# Patient Record
Sex: Male | Born: 2000 | Hispanic: No | Marital: Single | Smoking: Current some day smoker
Health system: Southern US, Community
[De-identification: ages and names within clinical notes are randomized; demographics above are authoritative.]

## PROBLEM LIST (undated history)

## (undated) DIAGNOSIS — S065X9A Traumatic subdural hemorrhage with loss of consciousness of unspecified duration, initial encounter: Secondary | ICD-10-CM

## (undated) DIAGNOSIS — R5600 Simple febrile convulsions: Secondary | ICD-10-CM

## (undated) HISTORY — PX: SUBDURAL HEMATOMA EVACUATION VIA CRANIOTOMY: SUR319

## (undated) HISTORY — PX: INGUINAL HERNIA REPAIR: SUR1180

## (undated) HISTORY — DX: Simple febrile convulsions: R56.00

## (undated) HISTORY — DX: Traumatic subdural hemorrhage with loss of consciousness of unspecified duration, initial encounter: S06.5X9A

---
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
## 2018-02-20 DIAGNOSIS — F4323 Adjustment disorder with mixed anxiety and depressed mood: Secondary | ICD-10-CM | POA: Diagnosis not present

## 2018-03-12 DIAGNOSIS — F4323 Adjustment disorder with mixed anxiety and depressed mood: Secondary | ICD-10-CM | POA: Diagnosis not present

## 2018-03-26 DIAGNOSIS — F4323 Adjustment disorder with mixed anxiety and depressed mood: Secondary | ICD-10-CM | POA: Diagnosis not present

## 2018-05-28 ENCOUNTER — Encounter: Payer: Self-pay | Admitting: Pediatrics

## 2018-05-28 ENCOUNTER — Ambulatory Visit (INDEPENDENT_AMBULATORY_CARE_PROVIDER_SITE_OTHER): Payer: Medicaid Other | Admitting: Pediatrics

## 2018-05-28 ENCOUNTER — Other Ambulatory Visit: Payer: Self-pay

## 2018-05-28 DIAGNOSIS — R1013 Epigastric pain: Secondary | ICD-10-CM

## 2018-05-28 MED ORDER — OMEPRAZOLE 20 MG PO CPDR: DELAYED_RELEASE_CAPSULE | ORAL | 0 refills | Status: DC

## 2018-05-28 NOTE — Patient Instructions (Addendum)
Start the omeprazole as prescribed - one capsule daily for up to 30 days. This will help lower stomach acid so you will have less of the burning, empty sensation.  Drink lots of water. Avoid excessive amount of spicy, fatty or acid rich foods. No caffeine, alcohol or smoking/vaping - these behaviors could make the stomach problem worse.  Please call if not feeling better in the next week or if you feel worse or have new symptoms at any point.

## 2018-05-29 NOTE — Progress Notes (Signed)
Virtual Visit via Video Note  I connected with Roberto Hubbard   on 05/29/18 at 12 noon by a video enabled telemedicine application and verified that I am speaking with the correct person using two identifiers.   Location of patient/parent: outdoors   I discussed the limitations of evaluation and management by telemedicine and the availability of in person appointments.  I discussed that the purpose of this phone visit is to provide medical care while limiting exposure to the novel coronavirus.  The patient expressed understanding and agreed to proceed.  Reason for visit: stomach pain  History of Present Illness: Roberto Hubbard states he has had stomach pain for about one week that localizes to area above his belly button.  States sometimes nausea but no vomiting or diarrhea.  Describes pain as feeling like he has an "empty stomach".  Does not radiate to chest or back. Normal stool x 1 today and x 2-3 yesterday. No medicine or modifying factors. Nonsmoker and states only occasional alcohol.  States no fever, rash or cold symptoms. States otherwise his usual self.  PMH, problem list, medications and allergies, family and social history reviewed and updated as indicated.   Observations/Objective: Roberto Hubbard is viewed in an outdoors environment conversing with no signs of SOB. Color is good. He shows physician observer location of pain pointing to area above umbilicus but below xyphoid. He is instructed by physician to push on area and describe any pain; states no pain to palpation or pressure.  Assessment and Plan: 1. Epigastric pain Roberto Hubbard appears in overall good health by simple observation and is ambulating, conversing without voiced pain.  States no pain on palpation.  Statement of daily pain that feels like "empty stomach" and occasional nausea can be consistent with dyspepsia from excessive acid.  Will try trial of omeprazole and dietary manipulation (discussed with pt and listed in instructions).   Advised adequate fluids.  Follow up if medication intolerance, increased symptoms, lack of improvement within a week or concerns. No current concern of acute abdomen and no current need for imaging or labs. Cannot rule out minor ulcer at this time but initial treatment trial is appropriate and follow up defined. - omeprazole (PRILOSEC) 20 MG capsule; Take one capsule by mouth once a day for up to 30 days to relieve stomach pain and excess acid  Dispense: 30 capsule; Refill: 0  Follow Up Instructions: as needed   I discussed the assessment and treatment plan with the patient and/or parent/guardian. They were provided an opportunity to ask questions and all were answered. They agreed with the plan and demonstrated an understanding of the instructions.   They were advised to call back or seek an in-person evaluation in the emergency room if the symptoms worsen or if the condition fails to improve as anticipated.  I provided 10 minutes of non-face-to-face time and 5 minutes of care coordination during this encounter I was located at [facility] during this encounter.  Maree Erie, MD

## 2018-06-24 ENCOUNTER — Telehealth: Payer: Self-pay | Admitting: *Deleted

## 2018-06-24 ENCOUNTER — Encounter: Payer: Self-pay | Admitting: Pediatrics

## 2018-06-24 ENCOUNTER — Other Ambulatory Visit: Payer: Self-pay

## 2018-06-24 ENCOUNTER — Ambulatory Visit (INDEPENDENT_AMBULATORY_CARE_PROVIDER_SITE_OTHER): Payer: Medicaid Other | Admitting: Pediatrics

## 2018-06-24 DIAGNOSIS — Z20828 Contact with and (suspected) exposure to other viral communicable diseases: Secondary | ICD-10-CM | POA: Diagnosis not present

## 2018-06-24 DIAGNOSIS — Z20822 Contact with and (suspected) exposure to covid-19: Secondary | ICD-10-CM

## 2018-06-24 NOTE — Telephone Encounter (Signed)
From Dr. Kalman Jewels  I am requesting a test for Covid 19 for this 18 year old with a known exposure. He was at a family gathering 14 days ago. Since then 2 contacts at that gathering have tested positive. One was hospitalized 5 days ago and sent home 2 days ago. Patient has mild symptoms cough and fatigue. Subjective fever 10 days ago. Symptoms off and on for 10 days. No high risk family members and he does not work. He has been instructed to quarantine until called for testing and until results return.    Pt scheduled for testing tomorrow at [facility] site. Reviewed process with pt, verbalizes understanding. Pts# [phone]  Practice: [facility] Ph# [phone] Fax not provided.

## 2018-06-24 NOTE — Progress Notes (Signed)
Virtual Visit via Video Note  I connected with Roberto Hubbard 's patient  on 06/24/18 at  4:20 PM EDT by a video enabled telemedicine application and verified that I am speaking with the correct person using two identifiers.   Location of patient/parent: Car-parked   I discussed the limitations of evaluation and management by telemedicine and the availability of in person appointments.  I discussed that the purpose of this phone visit is to provide medical care while limiting exposure to the novel coronavirus.  The patient expressed understanding and agreed to proceed.  Reason for visit: Covid 19 exposure  History of Present Illness:   This 18 year old young man is concerned because he has had a covid 19 exposure. He was at a family gathering with several family members and family friends 2 weeks ago. Since that time 2 people at that gathering have tested positive for covid 19. One family friend was hospitalized for Covid 19 complications 5 days ago and sent home from hospital 2 days ago. Unique reports that 10 days ago he developed cough, subjective fever, fatigue and nausea. The fever resolved quickly but he has had intermittent cough and nausea since then. He denies shortness of breath. He denies chest pain. He is wel hydrated and appetite is normal. He lives with is parents 41 and 27 years ols and his 25 year old brother. No-one at home has underlying medical conditions. He does not currently work or go to school. He is in the car with 2 cousins, both have cough.    Observations/Objective: Alert 18 year old in NAD without cough.   Assessment and Plan:   1. Exposure to Covid-19 Virus Patient with known exposure to COVID 19 and mild symptoms. Testing ordered at [facility]. Testing site to notify patient.  Patient instructed to stay quarantined until test result is negative or until 14 days post onset of symptoms and symptom free.  Other contacts in the car have been instructed to  call their PCP or the [facility] hotline to discuss getting tested. Until then all have been asked to quarantine at home for 14 days after onset of symptoms and resolution of symptoms.  Patient given strict instruction on when to present to ER-worsening cough/fever/shortness of breath or chest pain and to go to [facility] ER f these symptoms develop.    Follow Up Instructions: as above   I discussed the assessment and treatment plan with the patient and/or parent/guardian. They were provided an opportunity to ask questions and all were answered. They agreed with the plan and demonstrated an understanding of the instructions.   They were advised to call back or seek an in-person evaluation in the emergency room if the symptoms worsen or if the condition fails to improve as anticipated.  I provided 20 minutes of non-face-to-face time and 10 minutes of care coordination during this encounter I was located at [facility] during this encounter.  Roberto Jewels, MD

## 2018-06-25 ENCOUNTER — Other Ambulatory Visit: Payer: Medicaid Other

## 2018-06-25 DIAGNOSIS — Z20822 Contact with and (suspected) exposure to covid-19: Secondary | ICD-10-CM

## 2018-06-25 DIAGNOSIS — R6889 Other general symptoms and signs: Secondary | ICD-10-CM | POA: Diagnosis not present

## 2018-06-27 LAB — NOVEL CORONAVIRUS, NAA: SARS-CoV-2, NAA: NOT DETECTED

## 2018-07-06 ENCOUNTER — Ambulatory Visit (INDEPENDENT_AMBULATORY_CARE_PROVIDER_SITE_OTHER): Payer: Medicaid Other | Admitting: Pediatrics

## 2018-07-06 ENCOUNTER — Other Ambulatory Visit: Payer: Self-pay

## 2018-07-06 DIAGNOSIS — R109 Unspecified abdominal pain: Secondary | ICD-10-CM | POA: Diagnosis not present

## 2018-07-06 NOTE — Progress Notes (Signed)
Virtual Visit via Video Note: attempted video and technology failed, patient consented to phone visit  I connected with Roberto Hubbard on 07/06/18 at  2:10 PM EDT by a phone and verified that I am speaking with the correct person using two identifiers.  Location: Patient: Home Provider: [place]    I discussed the limitations of evaluation and management by telemedicine and the availability of in person appointments. The patient expressed understanding and agreed to proceed.  History of Present Illness: Progressive nausea/vomiting and belly pain after eating over the last 3 months.  Belly symptoms specifically noted by patient to start after death of a brother.  He said he couldn't eat for 2 wks and "hasn't been right since".  Denies blood in vomit, no bowel symptoms, , no urinary symptoms, no rashes, no injuries, no connection to fatty foods.  Denies mobility of pain/fullness and says it is always immediately below ribs, cnetral/upper abdomen.  Says weight is 180s (30lbs more than last charted in 2018)   Observations/Objective: Visit over the phone due to technicial difficulties, patient clmly speaking in full sentences, no distress.  Did get emotional when discussing brother.  Assessment and Plan: Functional abdominal pain syndrome  Continue omperazole, can add prn maalox, will refer to behavioral health (this was discussed with patient and he is interested in investigating emotional components to these symptoms), will try to further reduce soda and increase water/fiber  Follow Up Instructions:    I discussed the assessment and treatment plan with the patient. The patient was provided an opportunity to ask questions and all were answered. The patient agreed with the plan and demonstrated an understanding of the instructions.   The patient was advised to call back or seek an in-person evaluation if the symptoms worsen or if the condition fails to improve as anticipated.  I provided 18  minutes of non-face-to-face time during this encounter.   Sherene Sires, DO   I was present during the entirety of this clinical encounter via phone visit, and was immediately available for the key elements of the service.  I developed the management plan that is described in the resident's note and we discussed it during the visit. I agree with the content of this note and it accurately reflects my decision making and observations.  Antony Odea, MD 07/06/18 4:19 PM

## 2018-07-07 ENCOUNTER — Emergency Department (HOSPITAL_COMMUNITY): Payer: Medicaid Other

## 2018-07-07 ENCOUNTER — Ambulatory Visit (INDEPENDENT_AMBULATORY_CARE_PROVIDER_SITE_OTHER): Payer: Medicaid Other | Admitting: Pediatrics

## 2018-07-07 ENCOUNTER — Encounter (HOSPITAL_COMMUNITY): Payer: Self-pay

## 2018-07-07 ENCOUNTER — Emergency Department (HOSPITAL_COMMUNITY)
Admission: EM | Admit: 2018-07-07 | Discharge: 2018-07-07 | Disposition: A | Discharge status: Home / Self Care | Payer: Medicaid Other | Attending: Emergency Medicine | Admitting: Emergency Medicine

## 2018-07-07 ENCOUNTER — Encounter: Payer: Self-pay | Admitting: Clinical

## 2018-07-07 ENCOUNTER — Ambulatory Visit: Payer: Medicaid Other | Admitting: Pediatrics

## 2018-07-07 ENCOUNTER — Other Ambulatory Visit: Payer: Self-pay

## 2018-07-07 ENCOUNTER — Ambulatory Visit (INDEPENDENT_AMBULATORY_CARE_PROVIDER_SITE_OTHER): Payer: Medicaid Other | Admitting: Clinical

## 2018-07-07 VITALS — BP 108/72 | Temp 97.7°F | Wt 169.6 lb

## 2018-07-07 DIAGNOSIS — R10812 Left upper quadrant abdominal tenderness: Secondary | ICD-10-CM | POA: Insufficient documentation

## 2018-07-07 DIAGNOSIS — R1013 Epigastric pain: Secondary | ICD-10-CM

## 2018-07-07 DIAGNOSIS — G8929 Other chronic pain: Secondary | ICD-10-CM | POA: Diagnosis not present

## 2018-07-07 DIAGNOSIS — F4329 Adjustment disorder with other symptoms: Secondary | ICD-10-CM

## 2018-07-07 DIAGNOSIS — K824 Cholesterolosis of gallbladder: Secondary | ICD-10-CM | POA: Diagnosis not present

## 2018-07-07 DIAGNOSIS — R112 Nausea with vomiting, unspecified: Secondary | ICD-10-CM | POA: Insufficient documentation

## 2018-07-07 DIAGNOSIS — R634 Abnormal weight loss: Secondary | ICD-10-CM | POA: Diagnosis not present

## 2018-07-07 DIAGNOSIS — Z87820 Personal history of traumatic brain injury: Secondary | ICD-10-CM | POA: Insufficient documentation

## 2018-07-07 DIAGNOSIS — Z79899 Other long term (current) drug therapy: Secondary | ICD-10-CM | POA: Insufficient documentation

## 2018-07-07 DIAGNOSIS — Z87891 Personal history of nicotine dependence: Secondary | ICD-10-CM | POA: Diagnosis not present

## 2018-07-07 DIAGNOSIS — R10811 Right upper quadrant abdominal tenderness: Secondary | ICD-10-CM | POA: Diagnosis not present

## 2018-07-07 DIAGNOSIS — R1084 Generalized abdominal pain: Secondary | ICD-10-CM | POA: Diagnosis not present

## 2018-07-07 DIAGNOSIS — R109 Unspecified abdominal pain: Secondary | ICD-10-CM

## 2018-07-07 LAB — CBC WITH DIFFERENTIAL/PLATELET
Abs Immature Granulocytes: 0.01 10*3/uL (ref 0.00–0.07)
Basophils Absolute: 0.1 10*3/uL (ref 0.0–0.1)
Basophils Relative: 1 %
Eosinophils Absolute: 0 10*3/uL (ref 0.0–0.5)
Eosinophils Relative: 1 %
HCT: 48.4 % (ref 39.0–52.0)
Hemoglobin: 17.4 g/dL — ABNORMAL HIGH (ref 13.0–17.0)
Immature Granulocytes: 0 %
Lymphocytes Relative: 30 %
Lymphs Abs: 1.5 10*3/uL (ref 0.7–4.0)
MCH: 31.9 pg (ref 26.0–34.0)
MCHC: 36 g/dL (ref 30.0–36.0)
MCV: 88.8 fL (ref 80.0–100.0)
Monocytes Absolute: 0.4 10*3/uL (ref 0.1–1.0)
Monocytes Relative: 8 %
Neutro Abs: 3 10*3/uL (ref 1.7–7.7)
Neutrophils Relative %: 60 %
Platelets: 294 10*3/uL (ref 150–400)
RBC: 5.45 MIL/uL (ref 4.22–5.81)
RDW: 12.8 % (ref 11.5–15.5)
WBC: 5 10*3/uL (ref 4.0–10.5)
nRBC: 0 % (ref 0.0–0.2)

## 2018-07-07 LAB — COMPREHENSIVE METABOLIC PANEL
ALT: 27 U/L (ref 0–44)
AST: 21 U/L (ref 15–41)
Albumin: 4.6 g/dL (ref 3.5–5.0)
Alkaline Phosphatase: 66 U/L (ref 38–126)
Anion gap: 11 (ref 5–15)
BUN: 16 mg/dL (ref 6–20)
CO2: 23 mmol/L (ref 22–32)
Calcium: 9.5 mg/dL (ref 8.9–10.3)
Chloride: 106 mmol/L (ref 98–111)
Creatinine, Ser: 1.03 mg/dL (ref 0.61–1.24)
GFR calc Af Amer: >60 mL/min (ref >60)
GFR calc non Af Amer: >60 mL/min (ref >60)
Glucose, Bld: 97 mg/dL (ref 70–99)
Potassium: 4 mmol/L (ref 3.5–5.1)
Sodium: 140 mmol/L (ref 135–145)
Total Bilirubin: 1.2 mg/dL (ref 0.3–1.2)
Total Protein: 7.4 g/dL (ref 6.5–8.1)

## 2018-07-07 LAB — LIPASE, BLOOD: Lipase: 31 U/L (ref 11–51)

## 2018-07-07 MED ORDER — ONDANSETRON 4 MG PO TBDP: 4.0000 mg | ORAL_TABLET | Freq: Three times a day (TID) | ORAL | 0 refills | Status: DC | PRN

## 2018-07-07 MED ORDER — ALUM & MAG HYDROXIDE-SIMETH 200-200-20 MG/5ML PO SUSP
30.0000 mL | Freq: Once | ORAL | Status: AC
  Administered 2018-07-07: 30 mL via ORAL
  Filled 2018-07-07: qty 30

## 2018-07-07 MED ORDER — LIDOCAINE VISCOUS HCL 2 % MT SOLN
15.0000 mL | Freq: Once | OROMUCOSAL | Status: AC
  Administered 2018-07-07: 15 mL via ORAL
  Filled 2018-07-07: qty 15

## 2018-07-07 MED ORDER — SODIUM CHLORIDE 0.9 % IV BOLUS
1000.0000 mL | Freq: Once | INTRAVENOUS | Status: AC
  Administered 2018-07-07: 1000 mL via INTRAVENOUS

## 2018-07-07 MED ORDER — FAMOTIDINE IN NACL 20-0.9 MG/50ML-% IV SOLN
20.0000 mg | Freq: Once | INTRAVENOUS | Status: AC
  Administered 2018-07-07: 20 mg via INTRAVENOUS
  Filled 2018-07-07: qty 50

## 2018-07-07 MED ORDER — OMEPRAZOLE 40 MG PO CPDR: 40.0000 mg | DELAYED_RELEASE_CAPSULE | Freq: Every day | ORAL | 0 refills | Status: DC

## 2018-07-07 MED ORDER — SUCRALFATE 1 GM/10ML PO SUSP: 1.0000 g | Freq: Three times a day (TID) | ORAL | 0 refills | Status: DC

## 2018-07-07 NOTE — ED Triage Notes (Addendum)
Pt sent by PCP for evaluation of mid, upper abd pain x a couple of months, but says it has gotten worse in the past month and a half; endorses nausea today; denies cough, fever, diarrhea; endorses some vomiting; denies recent sick contacts; pt states recent travel to [facility], from last Thursday until yesterday

## 2018-07-07 NOTE — ED Provider Notes (Signed)
[facility] Provider Note   CSN: [id number] Arrival date & time: 07/07/18  1220     History   Chief Complaint Chief Complaint  Patient presents with  . Abdominal Pain    HPI Roberto Hubbard is a 18 y.o. male with a history of prior bilateral inguinal hernia repairs who presents to the emergency department with complaints of abdominal pain which has been occurring for the past 2 to 3 months.  Patient states that his symptoms began about 6 months ago with the death of his sibling, he states at that time he was not really eating much and was having some mild abdominal discomfort, this seemed to have resolved, however about 2 to 3 months ago discomfort returned.  He states he is having pain to the epigastric area which has become constant and more severe over the past month or so. Reports the pain is to the epigastric area, it is sharp, constant, worse with eating, no alleviating factors. About 2 weeks ago he just developed associated nausea and vomiting, vomiting typically occurs after PO intake, able to tolerate PO sometimes though.  He was seen by an outpatient provider and started on omeprazole 20 mg daily approximately 5 days ago, he has been taking this without relief of his symptoms.  Denies fever, chills, hematemesis, chest pain, dyspnea, diarrhea, melena, hematochezia, or constipation.  Offered translator line, however patient would prefer to speak [place].    HPI  Past Medical History:  Diagnosis Date  . Subdural hematoma (HCC) 01/23/2014    Patient Active Problem List   Diagnosis Date Noted  . Right varicocele 10/22/2016  . Personal history of traumatic brain injury 01/23/2014    Past Surgical History:  Procedure Laterality Date  . INGUINAL HERNIA REPAIR     bilateral        Home Medications    Prior to Admission medications   Medication Sig Start Date End Date Taking? Authorizing Provider  omeprazole (PRILOSEC) 20 MG  capsule Take one capsule by mouth once a day for up to 30 days to relieve stomach pain and excess acid 05/28/18   Maree ErieStanley, Angela J, MD    Family History Family History  Problem Relation Age of Onset  . Cancer Paternal Grandmother        bone, throat  . Cancer Paternal Aunt        uterine  . Diabetes Maternal Grandmother     Social History Social History   Tobacco Use  . Smoking status: Former Games developermoker  . Smokeless tobacco: Never Used  Substance Use Topics  . Alcohol use: No  . Drug use: No     Allergies   Patient has no known allergies.   Review of Systems Review of Systems  Constitutional: Negative for chills and fever.  Respiratory: Negative for shortness of breath.   Cardiovascular: Negative for chest pain.  Gastrointestinal: Positive for abdominal pain, nausea and vomiting. Negative for anal bleeding, blood in stool, constipation and diarrhea.  Genitourinary: Negative for dysuria.  Neurological: Negative for syncope.  All other systems reviewed and are negative.    Physical Exam Updated Vital Signs BP 130/90 (BP Location: Right Arm)   Pulse (!) 58   Temp 98.1 F (36.7 C) (Oral)   Resp 16   Ht 5\' 8"  (1.727 m)   Wt 73.5 kg   SpO2 99%   BMI 24.63 kg/m   Physical Exam Vitals signs and nursing note reviewed.  Constitutional:  General: He is not in acute distress.    Appearance: He is well-developed. He is not toxic-appearing.  HENT:     Head: Normocephalic and atraumatic.  Eyes:     General:        Right eye: No discharge.        Left eye: No discharge.     Conjunctiva/sclera: Conjunctivae normal.  Neck:     Musculoskeletal: Neck supple.  Cardiovascular:     Rate and Rhythm: Normal rate and regular rhythm.  Pulmonary:     Effort: Pulmonary effort is normal. No respiratory distress.     Breath sounds: Normal breath sounds. No wheezing, rhonchi or rales.  Abdominal:     General: There is no distension.     Palpations: Abdomen is soft.      Tenderness: There is abdominal tenderness. There is no guarding or rebound. Negative signs include Murphy's sign.     Comments: Diffuse upper abdominal tenderness with the epigastric area being the most prominent area of tenderness palpation.  Skin:    General: Skin is warm and dry.     Findings: No rash.  Neurological:     Mental Status: He is alert.     Comments: Clear speech.   Psychiatric:        Behavior: Behavior normal.    ED Treatments / Results  Labs (all labs ordered are listed, but only abnormal results are displayed) Labs Reviewed  CBC WITH DIFFERENTIAL/PLATELET - Abnormal; Notable for the following components:      Result Value   Hemoglobin 17.4 (*)    All other components within normal limits  COMPREHENSIVE METABOLIC PANEL  LIPASE, BLOOD    EKG    Radiology US Abdomen Limited Ruq  Result Date: 07/07/2018 CLINICAL DATA:  Abdominal pain, epigastric pain for several months EXAM: ULTRASOUND ABDOMEN LIMITED RIGHT UPPER QUADRANT COMPARISON:  None FINDINGS: Gallbladder: Normally distended without stones or wall thickening. 4 mm diameter gallbladder polyp identified. No sonographic Murphy sign or pericholecystic fluid Common bile duct: Diameter: 2 mm diameter, normal Liver: Normal parenchymal echogenicity. Small hyperechoic nodule anteriorly within liver 2.0 x 2.6 x 1.9 cm question small hemangioma. No additional hepatic mass or nodularity. Portal vein is patent on color Doppler imaging with normal direction of blood flow towards the liver. No RIGHT upper quadrant free fluid. IMPRESSION: 4 mm gallbladder polyp. Hyperechoic hepatic nodule 2.0 x 2.6 x 1.9 cm, suspect small hemangioma. Otherwise negative exam. Electronically Signed   By: Lavonia Dana M.D.   On: 07/07/2018 13:42    Procedures Procedures (including critical care time)  Medications Ordered in ED Medications  famotidine (PEPCID) IVPB 20 mg premix (20 mg Intravenous New Bag/Given 07/07/18 1256)  sodium chloride 0.9  % bolus 1,000 mL (1,000 mLs Intravenous New Bag/Given 07/07/18 1255)  alum & mag hydroxide-simeth (MAALOX/MYLANTA) 200-200-20 MG/5ML suspension 30 mL (30 mLs Oral Given 07/07/18 1253)    And  lidocaine (XYLOCAINE) 2 % viscous mouth solution 15 mL (15 mLs Oral Given 07/07/18 1253)     Initial Impression / Assessment and Plan / ED Course  I have reviewed the triage vital signs and the nursing notes.  Pertinent labs & imaging results that were available during my care of the patient were reviewed by me and considered in my medical decision making (see chart for details).    Patient presents to the ED with complaints of abdominal pain. Patient nontoxic appearing, in no apparent distress, vitals without significant abnormality. On exam patient tender to  upper abdomen mostly to epigastric area, no peritoneal signs, negative murphys. Will evaluate with labs and RUQ US. Trial of pepcid, GI cocktail & fluids.   ER work-up reviewed & reassuring:  CBC: No anemia or leukocytosis  CMP: No electrolyte derangement. LFTs & renal function preserved.  Lipase: WNL Imaging- RUQ US: 4 mm gallbladder polyp. Hyperechoic hepatic nodule 2.0 x 2.6 x 1.9 cm, suspect small hemangioma. Otherwise negative exam.  On repeat abdominal exam patient remains without peritoneal signs, doubt cholecystitis, pancreatitis, diverticulitis, appendicitis, or bowel obstruction/perforation. He is not having hematemesis, melena/hematochezia, BUN elevation or anemia to suggest acute GI bleed. Suspect GERD vs. PUD. Will increase omeprazole to 40 mg daily and provide prescription for Carafate as well as a few tablets of PRN zofran as patient is feeling much better in the Er and tolerating PO.  Will discharge home with supportive measures. I discussed results, treatment plan, need for PCP follow-up, and return precautions with the patient. Provided opportunity for questions, patient confirmed understanding and is in agreement with plan.   Final  Clinical Impressions(s) / ED Diagnoses   Final diagnoses:  Abdominal pain    ED Discharge Orders         Ordered    omeprazole (PRILOSEC) 40 MG capsule  Daily     07/07/18 1425    sucralfate (CARAFATE) 1 GM/10ML suspension  3 times daily with meals & bedtime     07/07/18 1425    ondansetron (ZOFRAN ODT) 4 MG disintegrating tablet  Every 8 hours PRN     07/07/18 1425           Stanlee Roehrig, BowerstonSamantha R, PA-C 07/07/18 1427    Gerhard MunchLockwood, Robert, MD 07/07/18 1555

## 2018-07-07 NOTE — BH Specialist Note (Signed)
Integrated Behavioral Health Initial Visit  MRN: [id number] Name: Roberto Hubbard  Number of [place] Clinician visits:: 1/6 Session Start time: 10:40 AM     Session End time: 11:00 AM  Total time: 20 minutes  Type of Service: [place] Interpretor:No. Interpretor Name and Language: n/a   Warm Hand Off Completed.       SUBJECTIVE: Roberto Hubbard is a 18 y.o. male accompanied by self Patient was referred by Dr. Excell Seltzer for grief, stomach pains. 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 Increase knowledge and/or ability of: coping skills - relaxation skills   INTERVENTIONS: Interventions utilized: Mindfulness or Relaxation Training  Standardized Assessments completed: Not Needed  ASSESSMENT: Patient currently experiencing abdominal pain, nausea which has disrupted his work and sleep.   Patient may benefit from scheduling a follow up with his therapist at [place].  PLAN: 1. Follow up with behavioral health clinician on : No follow up since patient has therapist 2. Behavioral recommendations: Follow up with therapist at [facility] 3. Referral(s): Armed forces logistics/support/administrative officer (LME/Outside Clinic) Roberto Hubbard will follow up with therapist a [place], [place] 4. "From scale of 1-10, how likely are you to follow plan?": Roberto Hubbard agreeable to the plan above  Roberto Rakes, Roberto Hubbard

## 2018-07-07 NOTE — Progress Notes (Signed)
Subjective:        History provider by patient No interpreter necessary.  Chief Complaint  Patient presents with  . Abdominal Pain    ongoing abd pains. omeprazole not helpful. epigastric area.   . Nausea    with occas emesis.   . Weight Loss    patient states weighed 186# 2 wks ago.     HPI: Shaheem Pichon [place], is a 18 y.o. male who presents to clinic with 2 months of worsening epigastric pain. He reports that around 5 months ago that his brother passed away in a tragic accident and then two weeks after he did not eat or drink anything. Afterwards he slowly started eating again and was able to resume a normal diet. Around 2 months ago, he started having abdominal pain especially in his epigastric region that was intermittent and usually present once a week. About two weeks ago, the pain became severe 8/10 and also has constant nausea. He has not been able to keep food or liquids down and has been vomiting food. He denies any blood his vomit. He reports that he went to [facility] recently in the past few days and was continuing to have pain and nausea there. He reports that he checked his weight three weeks ago and weighed about 186 pounds, and has since lost 17 lbs. He also reports night sweats for the past few days, in which he gets completely drenched. He denies any cough, congestion, chest pain, diarrhea. He reports that he has been having difficulty having regular bowel movements and regularly sits on the toilet and nothing comes out. He was started on omeprazole 20 mg daily on 6/11 and has been taking it daily, with no changes to his symptoms thus far. He does note that about 4 weeks ago he came into contact with someone with COVID but was tested on 6/4 and it was negative. He does endorse dizziness with upright but denies any incidents or feeling of passing out. His pain has affected his ability to sleep and completed normal activities.   Social: He lives with his mother, father, and 41 year  old brother. He reports alcohol use about 10 beers in the past 5 months. He says that his use has decreased since his brother's death. He reports marijuana use every other day but decreased since starting omeprazole. He denies other illicit drugs. He was last sexually active a month ago. He denies any dysuria or penile discharge.   Review of Systems  Constitutional: Positive for activity change, appetite change and unexpected weight change. Negative for fever.  HENT: Negative for congestion, rhinorrhea, sneezing and sore throat.   Eyes: Negative for pain, discharge and redness.  Respiratory: Negative for cough, chest tightness and shortness of breath.   Gastrointestinal: Positive for abdominal pain, nausea and vomiting. Negative for blood in stool and constipation.  Genitourinary: Negative for difficulty urinating, discharge, dysuria, flank pain, frequency, penile pain, penile swelling, scrotal swelling and testicular pain.  Musculoskeletal: Negative for arthralgias and myalgias.  Skin: Negative for color change, pallor and wound.  Neurological: Positive for dizziness and light-headedness. Negative for syncope.     Patient's history was reviewed and updated as appropriate: allergies, current medications, past family history, past medical history, past social history, past surgical history and problem list.     Objective:     BP 108/72 (Patient Position: Sitting)   Temp 97.7 F (36.5 C) (Temporal)   Wt 169 lb 9.6 oz (76.9 kg)   BMI  27.58 kg/m   Physical Exam GEN: Awake, alert in no acute distress, Able to converse appropriately, reports nausea while talking  HEENT: Normocephalic, atraumatic. PERRL. Conjunctiva clear. TM normal bilaterally. Moist mucus membranes. Oropharynx normal with no erythema or exudate. Neck supple. No cervical lymphadenopathy.  CV: Regular rate and rhythm. No murmurs, rubs or gallops. Normal radial pulses and capillary refill. RESP: Normal work of breathing. Lungs  clear to auscultation bilaterally with no wheezes, rales or crackles.  GI: Hypoactive bowel sounds. Abdomen soft, tender to palpate in epigastric region, feels referred pain when palpating in RLQ, LUQ, RUQ, non-distended with no hepatosplenomegaly or masses. No rebound tenderness. Negative psoas sign. Negative obturator. Negative murphy's sign.  SKIN: two small linear hyperpigmented skin lesion along left upper shoulder.  NEURO: Alert, moves all extremities normally.     Assessment & Plan:   Randi College is a 18 y.o. male who presented to clinic with 2 months of worsening epigastric pain and associated nausea and vomiting. On exam, he is stable, afebrile and in no acute distress. Given his symptoms of worsening abdominal pain, 17 lbs of weight loss in 3 weeks with associated nausea and emesis, concerned about intra-abdominal process. Differential includes PUD, Pancreatitis, Gastritis, or Gastroparesis. Gastric Malignancy seems unlikely given his age. I would not expect any improvement from the PPI as it was just started a few days ago but given his severe abdominal pain, N/V and weight loss, I feel that he would benefit from further evaluation involving CBC, CMP, Lipase, CRP, ESR, and abdominal imaging such as [place] to further assess etiologies. He would also benefit from EGD with Adult GI to rule out PUD. Discussed with the Pediatric ED and given this is a chronic issue, feel that he will benefit from better evaluation & management via direct admit to the pediatric floor as he has been seen by pediatric physicians in the clinic.   Supportive care and return precautions reviewed.   Richarda Overlie, MD PGY1

## 2018-07-07 NOTE — ED Notes (Signed)
Patient transported to Ultrasound 

## 2018-07-07 NOTE — Discharge Instructions (Signed)
You were seen in the emergency department today for abdominal pain and vomiting.  Your labs were reassuring.  Your ultrasound showed that you have a small gallbladder polyp and small liver hemangioma-each of these findings can be discussed with a primary care provider.  We suspect your symptoms are related to gastroesophageal reflux disease and/or peptic ulcer disease.  We have increased her dose of omeprazole to 40 mg daily.  We are also sending her with Carafate to take prior to meals and prior to bedtime as well as Zofran to take every 8 hours as needed for nausea and vomiting.  We have prescribed you new medication(s) today. Discuss the medications prescribed today with your pharmacist as they can have adverse effects and interactions with your other medicines including over the counter and prescribed medications. Seek medical evaluation if you start to experience new or abnormal symptoms after taking one of these medicines, seek care immediately if you start to experience difficulty breathing, feeling of your throat closing, facial swelling, or rash as these could be indications of a more serious allergic reaction  Please follow-up with your primary care provider within 3 to 5 days.  Follow the attached diet guidelines.  Return to the ER for new or worsening symptoms including but not limited to worsening pain, inability to keep fluids down, blood in vomit or stool, fever, or any other concerns.

## 2018-07-20 ENCOUNTER — Telehealth: Payer: Self-pay | Admitting: Pediatrics

## 2018-07-20 NOTE — Telephone Encounter (Signed)

## 2018-07-21 ENCOUNTER — Ambulatory Visit: Payer: Medicaid Other | Admitting: Pediatrics

## 2018-08-04 ENCOUNTER — Ambulatory Visit (INDEPENDENT_AMBULATORY_CARE_PROVIDER_SITE_OTHER): Payer: Medicaid Other | Admitting: Pediatrics

## 2018-08-04 ENCOUNTER — Other Ambulatory Visit: Payer: Self-pay

## 2018-08-04 ENCOUNTER — Encounter: Payer: Self-pay | Admitting: Pediatrics

## 2018-08-04 DIAGNOSIS — R1013 Epigastric pain: Secondary | ICD-10-CM | POA: Diagnosis not present

## 2018-08-04 MED ORDER — OMEPRAZOLE 40 MG PO CPDR: 40.0000 mg | DELAYED_RELEASE_CAPSULE | Freq: Two times a day (BID) | ORAL | 0 refills | Status: DC

## 2018-08-04 MED ORDER — ONDANSETRON 4 MG PO TBDP: 4.0000 mg | ORAL_TABLET | Freq: Three times a day (TID) | ORAL | 0 refills | Status: DC | PRN

## 2018-08-04 MED ORDER — SUCRALFATE 1 GM/10ML PO SUSP: 1.0000 g | Freq: Three times a day (TID) | ORAL | 0 refills | Status: DC

## 2018-08-04 NOTE — Progress Notes (Signed)
Virtual Visit via Video Note  I connected with Roberto Hubbard on 08/04/18 at  4:30 PM EDT by a video enabled telemedicine application and verified that I am speaking with the correct person using two identifiers.   Location of patient/parent: home   I discussed the limitations of evaluation and management by telemedicine and the availability of in person appointments.  I discussed that the purpose of this telehealth visit is to provide medical care while limiting exposure to the novel coronavirus.  The patient expressed understanding and agreed to proceed.  Reason for visit: follow-up abdominal pain  History of Present Illness: Seen in the ER on 6/16 with normal evaluation.  Rx increased PPI and prn zofran and carafate.  Pain was getting better until about 4 days ago.  Appetite is ok, trying to avoid certain foods such as meats.  He is eating more fruits and vegetables.  He is not drinking coffee, he is drinking less soda (now 3 times per week).  Symptoms are worse if he eats late at night.  Pain is located in the epigastric and RUQ.    Increased pain and nausea for the past 3-4 days.  He is trying to eat breakfast in the morning when he takes his medication.  Taking omeprazole 40 mg each morning and carafate prn.  Missed maybe 1-2 doses of omeprazole during the past month.  Doesn't feel like the carafate helps too much.  Ran out of the zofran but wasn't having much nausea until about 4 days ago.  No vomiting but feels very nauseated in the morning.  Pain is mostly in the morning.    ROS: No headaches.   Appetite - hungry some days, not hungry other days.  Not fatigued  GI: no constipation, mild diarrhea at times, no blood in stool GU: no dysuria, or urinary frequency NO fever  Observations/Objective: Patient reports tenderness to palpation over his RUQ, epigastric area, and LUQ.  Pain is reported and worst over the RUQ.  Assessment and Plan:  Epigastric abdominal pain Symptoms are  worsening from prior.  Pain is most consistent with gastritis vs GERD.  Not adequately controlled with 40 mg omeprazole daily.  Will increase to 40 mg BID (high dose PPI).  Also reviewed dietary and lifestyle changes to help with symptoms  Refilled zofran and carafate for prn use.  Follow-up in the office in a a week or two .  If not improving with high-dose PPI, then will need to pursue further evaluation and likely referral to GI.  - omeprazole (PRILOSEC) 40 MG capsule; Take 1 capsule (40 mg total) by mouth 2 (two) times a day.  Dispense: 60 capsule; Refill: 0 - ondansetron (ZOFRAN ODT) 4 MG disintegrating tablet; Take 1 tablet (4 mg total) by mouth every 8 (eight) hours as needed for nausea or vomiting.  Dispense: 10 tablet; Refill: 0 - sucralfate (CARAFATE) 1 GM/10ML suspension; Take 10 mLs (1 g total) by mouth 4 (four) times daily -  with meals and at bedtime.  Dispense: 420 mL; Refill: 0   Follow Up Instructions: onsite visit to follow-up abdominal pain in 1-2 weeks.    I discussed the assessment and treatment plan with the patient and/or parent/guardian. They were provided an opportunity to ask questions and all were answered. They agreed with the plan and demonstrated an understanding of the instructions.   They were advised to call back or seek an in-person evaluation in the emergency room if the symptoms worsen or if the  condition fails to improve as anticipated.   I was located at clinic during this encounter.  Clifton CustardKate Scott Genine Beckett, MD

## 2018-08-06 DIAGNOSIS — R1013 Epigastric pain: Secondary | ICD-10-CM | POA: Insufficient documentation

## 2018-08-11 ENCOUNTER — Ambulatory Visit: Payer: Medicaid Other | Admitting: Pediatrics

## 2018-08-14 ENCOUNTER — Ambulatory Visit: Payer: Medicaid Other | Admitting: Pediatrics

## 2018-08-21 ENCOUNTER — Encounter: Payer: Self-pay | Admitting: Pediatrics

## 2018-08-21 ENCOUNTER — Ambulatory Visit (INDEPENDENT_AMBULATORY_CARE_PROVIDER_SITE_OTHER): Payer: Medicaid Other | Admitting: Pediatrics

## 2018-08-21 ENCOUNTER — Other Ambulatory Visit: Payer: Self-pay

## 2018-08-21 VITALS — BP 116/68 | HR 76 | Ht 66.0 in | Wt 168.4 lb

## 2018-08-21 DIAGNOSIS — G479 Sleep disorder, unspecified: Secondary | ICD-10-CM

## 2018-08-21 DIAGNOSIS — Z639 Problem related to primary support group, unspecified: Secondary | ICD-10-CM

## 2018-08-21 DIAGNOSIS — Z68.41 Body mass index (BMI) pediatric, 85th percentile to less than 95th percentile for age: Secondary | ICD-10-CM

## 2018-08-21 DIAGNOSIS — K2970 Gastritis, unspecified, without bleeding: Secondary | ICD-10-CM

## 2018-08-21 DIAGNOSIS — Z0001 Encounter for general adult medical examination with abnormal findings: Secondary | ICD-10-CM

## 2018-08-21 DIAGNOSIS — Z113 Encounter for screening for infections with a predominantly sexual mode of transmission: Secondary | ICD-10-CM | POA: Diagnosis not present

## 2018-08-21 DIAGNOSIS — H00015 Hordeolum externum left lower eyelid: Secondary | ICD-10-CM

## 2018-08-21 LAB — POCT RAPID HIV: Rapid HIV, POC: NEGATIVE

## 2018-08-21 NOTE — Progress Notes (Signed)
Adolescent Well Care Visit Roberto Hubbard is a 18 y.o. male who is here for well care.    PCP:  Clifton CustardEttefagh, Kate Scott, MD   History was provided by the patient.  Confidentiality was discussed with the patient and, if applicable, with caregiver as well.  Current Issues: Current concerns include:  Hx of abdominal pain Omeprazole daily Not needing zofran or carafate Feels like pain is improving No vomiting, diarrhea, constipation, normal appetite. No longer having trouble with eating  Sometimes has diarrhea in early morning at 3-4am. Happens every other day.  Nutrition: Nutrition/Eating Behaviors: started keto diet, eats 3 meals per day. Does not eat as much rice, eating more fruit. 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  no Secondhand smoke exposure?  no Drugs/ETOH?  Yes, alcohol  Sexually Active?  yes  Women, x3 partners in the past year Uses condoms Pregnancy Prevention: condoms  Safe at home, in school & in relationships?  Yes Safe to self?  Yes   Screenings: Patient has a dental home: yes  The patient completed the Rapid Assessment for Adolescent Preventive Services screening questionnaire and the following topics were identified as risk factors and discussed:none In addition, the following topics were discussed as part of anticipatory guidance healthy eating, exercise, tobacco use, marijuana use, drug use and condom use.  PHQ-9 completed and results indicated 4  Physical  Exam:  Vitals:   08/21/18 1057  BP: 116/68  Pulse: 76  SpO2: 95%  Weight: 168 lb 6.4 oz (76.4 kg)  Height: 5\' 6"  (1.676 m)   BP 116/68 (BP Location: Right Arm, Patient Position: Sitting, Cuff Size: Normal)   Pulse 76   Ht 5\' 6"  (1.676 m)   Wt 168 lb 6.4 oz (76.4 kg)   SpO2 95%   BMI 27.18 kg/m  Body mass index: body mass index is 27.18 kg/m. Blood pressure percentiles are not available for patients who are 18 years or older.   Hearing Screening   Method: Audiometry   125Hz  250Hz  500Hz  1000Hz  2000Hz  3000Hz  4000Hz  6000Hz  8000Hz   Right ear:   20 20 20  20     Left ear:   20 20 20  20       Visual Acuity Screening   Right eye Left eye Both eyes  Without correction: 20/20 20/20 20/20   With correction:       General Appearance:   alert, oriented, no acute distress and well nourished  HENT: Normocephalic, no obvious abnormality, conjunctiva clear. Small white spot on bottom eyelid  Mouth:   Normal appearing teeth, no obvious discoloration, dental caries, or dental caps  Neck:   Supple; thyroid: no enlargement, symmetric, no tenderness/mass/nodules  Chest normal  Lungs:   Clear to auscultation bilaterally, normal work of breathing  Heart:   Regular rate and rhythm, S1 and S2 normal, no murmurs;   Abdomen:   Soft, mild tenderness to palpation around umbilicus, no mass, or organomegaly, no rebound, normal bowel sounds  GU normal male genitals, no testicular masses   Musculoskeletal:  Tone and strength strong and symmetrical, all extremities               Lymphatic:   No cervical adenopathy  Skin/Hair/Nails:   Skin warm, dry and intact, no rashes, no bruises or petechiae  Neurologic:   Strength, gait, and coordination normal and age-appropriate     Assessment and Plan:   1. Encounter for general adult medical examination with abnormal findings   2. BMI (body mass index), pediatric, 85% to less than 95% for age - discussed 55-2-1-0 - 5 fruits/vegetables a day - 2 or less  hours of screen time per day - 1 hour of exercise per day - 0 sugary drinks - went over myplate recommendations  3. Routine screening for STI (sexually transmitted infection) - POCTRapid HIV - C. trachomatis/N. gonorrhoeae RNA  4. Gastritis without bleeding, unspecified chronicity, unspecified gastritis type - differential includes functional abdominal pain, improving with omeprazole - takes zofran and carafate PRN, has not needed in some time. Does not need refills - continue current regimen with omeprazole BID, zofran and carafate PRN - discussed continuing to eat healthy, exercise, notify us if pain becomes worse or other concerning symptoms and can refer to GI for further evaluation  5. Sleeping difficulty - discussed sleep hygiene - recommended trying melatonin  6. Style of left eye - can use theratears for eye irritation - warm compresses  7. Family circumstance - brother passed away, he said he is coping well and declined behavioral health   BMI is not appropriate for age  Hearing screening result:normal Vision screening result: normal  Counseling provided for all of the vaccine components  Orders Placed This Encounter  Procedures  . C. trachomatis/N. gonorrhoeae RNA  . POCT Rapid HIV     Return for next [facility].Marney Doctor, MD

## 2018-08-24 LAB — C. TRACHOMATIS/N. GONORRHOEAE RNA
C. trachomatis RNA, TMA: NOT DETECTED
N. gonorrhoeae RNA, TMA: NOT DETECTED

## 2018-12-14 DIAGNOSIS — R1084 Generalized abdominal pain: Secondary | ICD-10-CM | POA: Diagnosis not present

## 2018-12-14 DIAGNOSIS — K219 Gastro-esophageal reflux disease without esophagitis: Secondary | ICD-10-CM | POA: Diagnosis not present

## 2018-12-15 ENCOUNTER — Ambulatory Visit (INDEPENDENT_AMBULATORY_CARE_PROVIDER_SITE_OTHER): Payer: Medicaid Other | Admitting: Student in an Organized Health Care Education/Training Program

## 2018-12-15 ENCOUNTER — Encounter: Payer: Self-pay | Admitting: Student in an Organized Health Care Education/Training Program

## 2018-12-15 DIAGNOSIS — R1013 Epigastric pain: Secondary | ICD-10-CM

## 2018-12-15 NOTE — Progress Notes (Signed)
Virtual Visit via Video Note  I connected with Kendry Pfarr 's patient  on 12/15/18 at 10:00 AM EST by a video enabled telemedicine application and verified that I am speaking with the correct person using two identifiers.   Location of patient/parent: home   I discussed the limitations of evaluation and management by telemedicine and the availability of in person appointments.  I discussed that the purpose of this telehealth visit is to provide medical care while limiting exposure to the novel coronavirus.  The patient expressed understanding and agreed to proceed.  Reason for visit:  Abdominal pain follow up  History of Present Illness:   Recent encounters: 08/21/18 [place]. Abd pain, daily omeprazole, diarrhea every other day in middle of night. PRN carafate, zofran. 07/07/18 2 months abd pain, vomiting, decreased PO, weight loss. Night sweats. Tourble passing stool. Started in setting of brother passing away.  Thoren was previously prescribed omeprazole, which she stopped taking a few months ago with concern that it was causing him hiccups.  Since that time, he has not been taking any medications, but has decreased intake of spicy foods and increased milk intake (which is helped settle his stomach in the past).  These interventions have not been helping recently though, and his pain has increased.  He went to [facility] ED yesterday (documentation unavailable) for pain, where he was started on Nexium 40 mg daily and famotidine 20 mg daily.   Pain -- epigastric, constant, not necessarily related to eating.  Vomiting -- resolved. Still has nausea.  PO -- appetite better, but fills up quickly. Small meals. Weight -- Reported 165lb 2 weeks ago. Noted to be "180s" on 07/06/18. On 07/07/18 noted to have "17 lbs of weight loss in 3 weeks." Stools -- every other day has diarrhea. Often feels urge to stool but can't.  No fevers, cough, vomiting.  The following portions of the patient's history  were reviewed and updated as appropriate: allergies, current medications, past family history, past medical history, past social history, past surgical history and problem list.   Observations/Objective:  Well-appearing, in no distress.  Mentating appropriately, interacting appropriately.  Walking around without difficulty.  Breathing comfortably.  Stopped working, unable to perform virtual abdominal exam.  Assessment and Plan:   1. Epigastric abdominal pain Lequan is a 18 year old male now with 7 months of epigastric pain, weight loss, intermittent but persistent NBNB diarrhea, and decreased p.o. intake.  Onset occurred after his brother passed away.  Pain has been worsening recently, and he was started on Nexium and Pepcid yesterday in the ED.  Previously failed omeprazole.  Virtual visit today so I did not perform exam. Concerning for gastritis, GERD. Negative lipase 07/07/18. DDx includes gall bladder disease. Anxiety, stress may play role as well and psych should be considered, despite reporting that he is coping well to loss of brother.  Previously reported night sweats, which have resolved.  Low suspision for TB, malignancy as cause of weight loss.  Continue daily Nexium, Pepcid. Refer to GI. If GI appointment will be unacceptably delayed, patient will call us to make follow up appointment, when I would recommend stool testing for H pylori -- needs to be off PPI prior to testing.  - Ambulatory referral to Gastroenterology   Follow Up Instructions:    I discussed the assessment and treatment plan with the patient and/or parent/guardian. They were provided an opportunity to ask questions and all were answered. They agreed with the plan and demonstrated an understanding of  the instructions.   They were advised to call back or seek an in-person evaluation in the emergency room if the symptoms worsen or if the condition fails to improve as anticipated.  I spent 20 minutes on this  telehealth visit inclusive of face-to-face video and care coordination time I was located at [facility] during this encounter.  Harlon Ditty, MD

## 2018-12-21 ENCOUNTER — Ambulatory Visit: Payer: Medicaid Other | Admitting: Gastroenterology

## 2019-01-19 ENCOUNTER — Ambulatory Visit: Payer: Medicaid Other | Attending: Internal Medicine

## 2019-01-19 DIAGNOSIS — Z20828 Contact with and (suspected) exposure to other viral communicable diseases: Secondary | ICD-10-CM | POA: Diagnosis not present

## 2019-01-19 DIAGNOSIS — Z20822 Contact with and (suspected) exposure to covid-19: Secondary | ICD-10-CM

## 2019-01-21 ENCOUNTER — Encounter: Payer: Self-pay | Admitting: *Deleted

## 2019-01-21 LAB — NOVEL CORONAVIRUS, NAA: SARS-CoV-2, NAA: DETECTED — AB

## 2019-07-29 ENCOUNTER — Emergency Department (HOSPITAL_COMMUNITY): Payer: Medicaid Other

## 2019-07-29 ENCOUNTER — Encounter (HOSPITAL_COMMUNITY): Payer: Self-pay

## 2019-07-29 ENCOUNTER — Emergency Department (HOSPITAL_COMMUNITY)
Admission: EM | Admit: 2019-07-29 | Discharge: 2019-07-29 | Disposition: A | Discharge status: Home / Self Care | Payer: Medicaid Other | Attending: Emergency Medicine | Admitting: Emergency Medicine

## 2019-07-29 ENCOUNTER — Other Ambulatory Visit: Payer: Self-pay

## 2019-07-29 DIAGNOSIS — Z79899 Other long term (current) drug therapy: Secondary | ICD-10-CM | POA: Diagnosis not present

## 2019-07-29 DIAGNOSIS — Y999 Unspecified external cause status: Secondary | ICD-10-CM | POA: Insufficient documentation

## 2019-07-29 DIAGNOSIS — Z87891 Personal history of nicotine dependence: Secondary | ICD-10-CM | POA: Diagnosis not present

## 2019-07-29 DIAGNOSIS — R0789 Other chest pain: Secondary | ICD-10-CM | POA: Insufficient documentation

## 2019-07-29 DIAGNOSIS — Y93I9 Activity, other involving external motion: Secondary | ICD-10-CM | POA: Diagnosis not present

## 2019-07-29 DIAGNOSIS — Y92410 Unspecified street and highway as the place of occurrence of the external cause: Secondary | ICD-10-CM | POA: Diagnosis not present

## 2019-07-29 DIAGNOSIS — S299XXA Unspecified injury of thorax, initial encounter: Secondary | ICD-10-CM | POA: Diagnosis not present

## 2019-07-29 DIAGNOSIS — R0781 Pleurodynia: Secondary | ICD-10-CM

## 2019-07-29 DIAGNOSIS — R52 Pain, unspecified: Secondary | ICD-10-CM

## 2019-07-29 MED ORDER — NAPROXEN 500 MG PO TABS
500.0000 mg | ORAL_TABLET | Freq: Two times a day (BID) | ORAL | 0 refills | Status: DC
Start: 2019-07-29 — End: 2020-01-10

## 2019-07-29 MED ORDER — METHOCARBAMOL 500 MG PO TABS
500.0000 mg | ORAL_TABLET | Freq: Two times a day (BID) | ORAL | 0 refills | Status: DC
Start: 2019-07-29 — End: 2020-01-10

## 2019-07-29 NOTE — Discharge Instructions (Signed)
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
## 2019-07-29 NOTE — ED Notes (Signed)
Patient verbalizes understanding of discharge instructions. Opportunity for questioning and answers were provided. Armband removed by staff, pt discharged from ED ambulatory.   

## 2019-09-15 DIAGNOSIS — M898X8 Other specified disorders of bone, other site: Secondary | ICD-10-CM | POA: Diagnosis not present

## 2019-09-15 DIAGNOSIS — M542 Cervicalgia: Secondary | ICD-10-CM | POA: Diagnosis not present

## 2019-09-15 DIAGNOSIS — M438X6 Other specified deforming dorsopathies, lumbar region: Secondary | ICD-10-CM | POA: Diagnosis not present

## 2019-09-15 DIAGNOSIS — M545 Low back pain: Secondary | ICD-10-CM | POA: Diagnosis not present

## 2019-09-15 DIAGNOSIS — M546 Pain in thoracic spine: Secondary | ICD-10-CM | POA: Diagnosis not present

## 2019-09-29 ENCOUNTER — Encounter (HOSPITAL_COMMUNITY): Payer: Self-pay | Admitting: *Deleted

## 2019-09-29 ENCOUNTER — Emergency Department (HOSPITAL_COMMUNITY)
Admission: EM | Admit: 2019-09-29 | Discharge: 2019-09-30 | Disposition: A | Discharge status: Home / Self Care | Payer: Medicaid Other | Attending: Emergency Medicine | Admitting: Emergency Medicine

## 2019-09-29 ENCOUNTER — Other Ambulatory Visit: Payer: Self-pay

## 2019-09-29 DIAGNOSIS — Z5321 Procedure and treatment not carried out due to patient leaving prior to being seen by health care provider: Secondary | ICD-10-CM | POA: Insufficient documentation

## 2019-09-29 DIAGNOSIS — M542 Cervicalgia: Secondary | ICD-10-CM | POA: Insufficient documentation

## 2019-09-29 DIAGNOSIS — R519 Headache, unspecified: Secondary | ICD-10-CM | POA: Diagnosis not present
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
## 2019-09-30 NOTE — ED Notes (Signed)
Pt called for room no answer

## 2019-10-01 ENCOUNTER — Telehealth: Payer: Self-pay | Admitting: *Deleted

## 2019-10-01 NOTE — Telephone Encounter (Signed)
Roberto Hubbard presented to the ED and left before being seen by the provider on 09/30/19. The patient has been enrolled in an automated general discharge outreach program and 2 attempts to contact the patient will be made to follow up on their ED visit and subsequent needs. The care management team is available to provide assistance to this patient at any time.   Burnard Bunting, RN, BSN, CCRN [facility] [phone]

## 2019-11-29 ENCOUNTER — Ambulatory Visit (HOSPITAL_COMMUNITY): Payer: Self-pay

## 2019-12-01 ENCOUNTER — Other Ambulatory Visit: Payer: Medicaid Other

## 2019-12-01 DIAGNOSIS — Z20822 Contact with and (suspected) exposure to covid-19: Secondary | ICD-10-CM | POA: Diagnosis not present

## 2019-12-02 LAB — NOVEL CORONAVIRUS, NAA: SARS-CoV-2, NAA: NOT DETECTED

## 2019-12-02 LAB — SARS-COV-2, NAA 2 DAY TAT

## 2020-01-10 ENCOUNTER — Ambulatory Visit: Payer: Medicaid Other | Admitting: Medical-Surgical

## 2020-01-10 ENCOUNTER — Ambulatory Visit (INDEPENDENT_AMBULATORY_CARE_PROVIDER_SITE_OTHER): Payer: Medicaid Other | Admitting: Medical-Surgical

## 2020-01-10 ENCOUNTER — Encounter: Payer: Self-pay | Admitting: Medical-Surgical

## 2020-01-10 ENCOUNTER — Other Ambulatory Visit: Payer: Self-pay

## 2020-01-10 VITALS — BP 114/76 | HR 78 | Temp 98.1°F | Ht 67.5 in | Wt 175.3 lb

## 2020-01-10 DIAGNOSIS — Z7689 Persons encountering health services in other specified circumstances: Secondary | ICD-10-CM | POA: Diagnosis not present

## 2020-01-10 DIAGNOSIS — B353 Tinea pedis: Secondary | ICD-10-CM

## 2020-01-10 DIAGNOSIS — B352 Tinea manuum: Secondary | ICD-10-CM

## 2020-01-10 DIAGNOSIS — B356 Tinea cruris: Secondary | ICD-10-CM

## 2020-01-10 MED ORDER — ITRACONAZOLE 200 MG PO TABS: 200.0000 mg | ORAL_TABLET | Freq: Two times a day (BID) | ORAL | 0 refills | Status: DC

## 2020-01-10 MED ORDER — CLOTRIMAZOLE-BETAMETHASONE 1-0.05 % EX CREA: 1.0000 "application " | TOPICAL_CREAM | Freq: Two times a day (BID) | CUTANEOUS | 0 refills | Status: DC

## 2020-01-10 NOTE — Progress Notes (Signed)
New Patient Office Visit  Subjective:  Patient ID: Roberto Hubbard, male    DOB: 2000/06/29  Age: 19 y.o. MRN: [id number]  CC:  Chief Complaint  Patient presents with  . Establish Care  . Rash    HPI Roberto Hubbard presents to establish care.  Notes that he had a rash develop on his right foot approximately 2 months ago that has now spread to his left foot.  Rash is scaly, affecting the soles of his feet and extending up onto the knees and sides, often itchy at the end of the day.  A couple of weeks later he noted that his hands started itching bilaterally.  Unfortunately now the itching has started in his groin and he has developed a rash over the head of his penis.  He is an uncircumcised male and has noted some thick white discharge when he retracts the foreskin.  He did try some hydrocortisone cream over-the-counter which helped with itching but did not relieve the rash or the discharge.  Past Medical History:  Diagnosis Date  . Subdural hematoma (HCC) 01/23/2014    Past Surgical History:  Procedure Laterality Date  . INGUINAL HERNIA REPAIR     bilateral  . SUBDURAL HEMATOMA EVACUATION VIA CRANIOTOMY      Family History  Problem Relation Age of Onset  . Cancer Paternal Grandmother        bone, throat  . Cancer Paternal Aunt        uterine  . Diabetes Maternal Grandmother     Social History   Socioeconomic History  . Marital status: Single    Spouse name: Not on file  . Number of children: Not on file  . Years of education: Not on file  . Highest education level: Not on file  Occupational History  . Not on file  Tobacco Use  . Smoking status: Current Some Day Smoker  . Smokeless tobacco: Never Used  Vaping Use  . Vaping Use: Never used  Substance and Sexual Activity  . Alcohol use: No  . Drug use: No  . Sexual activity: Yes    Partners: Female    Birth control/protection: Condom  Other Topics Concern  . Not on file  Social History Narrative    lives  with 2 brothers, mother, father.  1 dog.  no smokers.  Mother speaks only [place], but Isaiahs speaks English quite well.      Social Determinants of Health   Financial Resource Strain: Not on file  Food Insecurity: Not on file  Transportation Needs: Not on file  Physical Activity: Not on file  Stress: Not on file  Social Connections: Not on file  Intimate Partner Violence: Not on file    ROS Review of Systems  Constitutional: Negative for chills, fatigue, fever and unexpected weight change.  Respiratory: Negative for cough, chest tightness and shortness of breath.   Cardiovascular: Negative for chest pain, palpitations and leg swelling.  Genitourinary: Negative for dysuria, frequency and urgency.  Skin: Positive for rash.  Neurological: Negative for dizziness, light-headedness and headaches.  Psychiatric/Behavioral: Positive for sleep disturbance. Negative for dysphoric mood, self-injury and suicidal ideas. The patient is not nervous/anxious.     Objective:   Today's Vitals: BP 114/76   Pulse 78   Temp 98.1 F (36.7 C)   Ht 5' 7.5" (1.715 m)   Wt 175 lb 4.8 oz (79.5 kg)   SpO2 98%   BMI 27.05 kg/m   Physical Exam Vitals and  nursing note reviewed.  Constitutional:      General: He is not in acute distress.    Appearance: Normal appearance.  HENT:     Head: Normocephalic and atraumatic.  Cardiovascular:     Rate and Rhythm: Normal rate and regular rhythm.     Pulses: Normal pulses.     Heart sounds: Normal heart sounds. No murmur heard. No friction rub. No gallop.   Pulmonary:     Effort: Pulmonary effort is normal. No respiratory distress.     Breath sounds: Normal breath sounds.  Skin:    General: Skin is warm and dry.  Neurological:     Mental Status: He is alert and oriented to person, place, and time.  Psychiatric:        Mood and Affect: Mood normal.        Behavior: Behavior normal.        Thought Content: Thought content normal.        Judgment:  Judgment normal.    Assessment & Plan:   1. Encounter to establish care Reviewed available information and discussed health care concerns with patient.  He is due for an annual physical exam.  2. Tinea pedis of both feet/Tinea cruris/Tinea manus With widespread fungal infection, we will treat orally with itraconazole 200 mg twice daily x7 days.  Also providing Lotrisone cream topically twice daily as needed, advised to avoid the groin and use sparingly.     Outpatient Encounter Medications as of 01/10/2020  Medication Sig  . clotrimazole-betamethasone (LOTRISONE) cream Apply 1 application topically 2 (two) times daily.  . Itraconazole 200 MG TABS Take 200 mg by mouth in the morning and at bedtime.  . [DISCONTINUED] esomeprazole (NEXIUM) 40 MG capsule Take 40 mg by mouth daily at 12 noon.  . [DISCONTINUED] methocarbamol (ROBAXIN) 500 MG tablet Take 1 tablet (500 mg total) by mouth 2 (two) times daily.  . [DISCONTINUED] naproxen (NAPROSYN) 500 MG tablet Take 1 tablet (500 mg total) by mouth 2 (two) times daily.  . [DISCONTINUED] omeprazole (PRILOSEC) 40 MG capsule Take 1 capsule (40 mg total) by mouth 2 (two) times a day. (Patient not taking: Reported on 12/15/2018)  . [DISCONTINUED] ondansetron (ZOFRAN ODT) 4 MG disintegrating tablet Take 1 tablet (4 mg total) by mouth every 8 (eight) hours as needed for nausea or vomiting. (Patient not taking: Reported on 12/15/2018)  . [DISCONTINUED] sucralfate (CARAFATE) 1 GM/10ML suspension Take 10 mLs (1 g total) by mouth 4 (four) times daily -  with meals and at bedtime. (Patient not taking: Reported on 12/15/2018)   No facility-administered encounter medications on file as of 01/10/2020.    Follow-up: Return for annual physical exam at your convenience.   Thayer Ohm, DNP, APRN, FNP-BC [facility]

## 2020-01-11 ENCOUNTER — Encounter: Payer: Self-pay | Admitting: Medical-Surgical

## 2020-01-11 DIAGNOSIS — B356 Tinea cruris: Secondary | ICD-10-CM

## 2020-01-11 DIAGNOSIS — B353 Tinea pedis: Secondary | ICD-10-CM

## 2020-01-11 DIAGNOSIS — B352 Tinea manuum: Secondary | ICD-10-CM

## 2020-01-11 MED ORDER — FLUCONAZOLE 150 MG PO TABS: 150.0000 mg | ORAL_TABLET | ORAL | 0 refills | Status: DC

## 2020-01-13 NOTE — Telephone Encounter (Signed)
PA request received for Itraconazole, but not submitted since medication has been changed.

## 2020-02-11 ENCOUNTER — Ambulatory Visit: Payer: Self-pay | Admitting: Nurse Practitioner

## 2020-08-02 ENCOUNTER — Other Ambulatory Visit: Payer: Self-pay

## 2020-08-02 ENCOUNTER — Ambulatory Visit (INDEPENDENT_AMBULATORY_CARE_PROVIDER_SITE_OTHER): Payer: Medicaid Other | Admitting: Medical-Surgical

## 2020-08-02 VITALS — BP 118/74 | HR 80 | Temp 98.1°F | Ht 67.5 in | Wt 173.0 lb

## 2020-08-02 DIAGNOSIS — Z113 Encounter for screening for infections with a predominantly sexual mode of transmission: Secondary | ICD-10-CM | POA: Diagnosis not present

## 2020-08-02 DIAGNOSIS — K219 Gastro-esophageal reflux disease without esophagitis: Secondary | ICD-10-CM | POA: Diagnosis not present

## 2020-08-02 MED ORDER — PANTOPRAZOLE SODIUM 40 MG PO TBEC: 40.0000 mg | DELAYED_RELEASE_TABLET | Freq: Every day | ORAL | 3 refills | Status: DC | PRN

## 2020-08-02 NOTE — Progress Notes (Signed)
Subjective:    CC: reflux, STI testing  HPI: Pleasant 20 year old male presenting today for the following:   Reflux- has a history of GERD that has been intermittent with periods of flares that last a few months then remission for months to years. When his symptoms flare, he experiences a burning sensation in the epigastric area, nausea in the morning, and waking with a bad taste in his mouth. His most recent flare started 2-3 weeks ago. Symptoms worse at night, better with standing or drinking milk. Had a ST and a slight cough a couple of days ago. Eats healthy overall but does admit to eating a lot of chicken wings of all "heats". Has found the wings promote the burning. Denies fever, chills, vomiting, hematochezia, and melena.   Has a new sexual partner and would like to have STI testing completed. No current symptoms and no known exposure.   I reviewed the past medical history, family history, social history, surgical history, and allergies today and no changes were needed.  Please see the problem list section below in epic for further details.  Past Medical History: Past Medical History:  Diagnosis Date   Subdural hematoma (HCC) 01/23/2014   Past Surgical History: Past Surgical History:  Procedure Laterality Date   INGUINAL HERNIA REPAIR     bilateral   SUBDURAL HEMATOMA EVACUATION VIA CRANIOTOMY     Social History: Social History   Socioeconomic History   Marital status: Single    Spouse name: Not on file   Number of children: Not on file   Years of education: Not on file   Highest education level: Not on file  Occupational History   Not on file  Tobacco Use   Smoking status: Some Days    Pack years: 0.00   Smokeless tobacco: Never  Vaping Use   Vaping Use: Never used  Substance and Sexual Activity   Alcohol use: No   Drug use: No   Sexual activity: Yes    Partners: Female    Birth control/protection: Condom  Other Topics Concern   Not on file  Social History  Narrative    lives with 2 brothers, mother, father.  1 dog.  no smokers.  Mother speaks only [place], but Yona speaks English quite well.      Social Determinants of Health   Financial Resource Strain: Not on file  Food Insecurity: Not on file  Transportation Needs: Not on file  Physical Activity: Not on file  Stress: Not on file  Social Connections: Not on file   Family History: Family History  Problem Relation Age of Onset   Cancer Paternal Grandmother        bone, throat   Cancer Paternal Aunt        uterine   Diabetes Maternal Grandmother    Allergies: No Known Allergies Medications: See med rec.  Review of Systems: See HPI for pertinent positives and negatives.   Objective:    General: Well Developed, well nourished, and in no acute distress.  Neuro: Alert and oriented x3.  HEENT: Normocephalic, atraumatic.  Skin: Warm and dry. Cardiac: Regular rate and rhythm, no murmurs rubs or gallops, no lower extremity edema.  Respiratory: Clear to auscultation bilaterally. Not using accessory muscles, speaking in full sentences. Abdomen: Soft, nontender, nondistended. Bowel sounds + x 4 quadrants. No HSM appreciated.  Impression and Recommendations:    1. Screen for sexually transmitted diseases Checking for STI's per patient request.  - C. trachomatis/N. gonorrhoeae RNA -  HIV Antibody (routine testing w rflx) - Hepatitis C Antibody - Hepatitis B surface antigen - RPR - Trichomonas vaginalis RNA, Ql,Males  2. Gastroesophageal reflux disease without esophagitis Start Protonix 40mg  daily for the next 2 weeks then daily as needed. Reviewed foods that can exacerbate GERD. Printed list provided with AVS.   Return in about 4 weeks (around 08/30/2020) for gerd follow up. ___________________________________________ 10/30/2020, DNP, APRN, FNP-BC Primary Care and Sports Medicine [facility]

## 2020-08-02 NOTE — Patient Instructions (Signed)

## 2020-08-03 LAB — HEPATITIS C ANTIBODY
Hepatitis C Ab: NONREACTIVE
SIGNAL TO CUT-OFF: 0.04 (ref <1.00)

## 2020-08-03 LAB — TRICHOMONAS VAGINALIS RNA, QL,MALES: Trichomonas vaginalis RNA: NOT DETECTED

## 2020-08-03 LAB — HEPATITIS B SURFACE ANTIGEN: Hepatitis B Surface Ag: NONREACTIVE

## 2020-08-03 LAB — RPR: RPR Ser Ql: NONREACTIVE

## 2020-08-03 LAB — HIV ANTIBODY (ROUTINE TESTING W REFLEX): HIV 1&2 Ab, 4th Generation: NONREACTIVE

## 2020-08-05 LAB — C. TRACHOMATIS/N. GONORRHOEAE RNA
C. trachomatis RNA, TMA: NOT DETECTED
N. gonorrhoeae RNA, TMA: NOT DETECTED

## 2020-08-23 ENCOUNTER — Ambulatory Visit (INDEPENDENT_AMBULATORY_CARE_PROVIDER_SITE_OTHER): Payer: Medicaid Other | Admitting: Medical-Surgical

## 2020-08-23 DIAGNOSIS — Z5329 Procedure and treatment not carried out because of patient's decision for other reasons: Secondary | ICD-10-CM

## 2020-08-23 DIAGNOSIS — K219 Gastro-esophageal reflux disease without esophagitis: Secondary | ICD-10-CM

## 2020-08-23 NOTE — Progress Notes (Signed)
   Complete physical exam  Patient: Roberto Hubbard   DOB: 11/10/2000   20 y.o. Male  MRN: [id number]  Subjective:    No chief complaint on file.   Roberto Hubbard is a 20 y.o. male who presents today for a complete physical exam. She reports consuming a {diet types:17450} diet. {types:19826} She generally feels {DESC; WELL/FAIRLY WELL/POORLY:18703}. She reports sleeping {DESC; WELL/FAIRLY WELL/POORLY:18703}. She {does/does not:200015} have additional problems to discuss today.    Most recent fall risk assessment:    07/18/2021   10:42 AM  Fall Risk   Falls in the past year? 0  Number falls in past yr: 0  Injury with Fall? 0  Risk for fall due to : No Fall Risks  Follow up Falls evaluation completed     Most recent depression screenings:    07/18/2021   10:42 AM 06/08/2020   10:46 AM  PHQ 2/9 Scores  PHQ - 2 Score 0 0  PHQ- 9 Score 5     {VISON DENTAL STD PSA (Optional):27386}  {History (Optional):23778}  Patient Care Team: Sinclair Alligood, NP as PCP - General (Nurse Practitioner)   Outpatient Medications Prior to Visit  Medication Sig   fluticasone (FLONASE) 50 MCG/ACT nasal spray Place 2 sprays into both nostrils in the morning and at bedtime. After 7 days, reduce to once daily.   norgestimate-ethinyl estradiol (SPRINTEC 28) 0.25-35 MG-MCG tablet Take 1 tablet by mouth daily.   Nystatin POWD Apply liberally to affected area 2 times per day   spironolactone (ALDACTONE) 100 MG tablet Take 1 tablet (100 mg total) by mouth daily.   No facility-administered medications prior to visit.    ROS        Objective:     There were no vitals taken for this visit. {Vitals History (Optional):23777}  Physical Exam   No results found for any visits on 08/23/21. {Show previous labs (optional):23779}    Assessment & Plan:    Routine Health Maintenance and Physical Exam  Immunization History  Administered Date(s) Administered   DTaP 01/24/1999, 03/22/1999,  05/31/1999, 02/14/2000, 08/30/2003   Hepatitis A 06/26/2007, 07/01/2008   Hepatitis B 11/11/1998, 12/19/1998, 05/31/1999   HiB (PRP-OMP) 01/24/1999, 03/22/1999, 05/31/1999, 02/14/2000   IPV 01/24/1999, 03/22/1999, 11/19/1999, 08/30/2003   Influenza,inj,Quad PF,6+ Mos 10/01/2013   Influenza-Unspecified 01/01/2012   MMR 11/18/2000, 08/30/2003   Meningococcal Polysaccharide 07/01/2011   Pneumococcal Conjugate-13 02/14/2000   Pneumococcal-Unspecified 05/31/1999, 08/14/1999   Tdap 07/01/2011   Varicella 11/19/1999, 06/26/2007    Health Maintenance  Topic Date Due   HIV Screening  Never done   Hepatitis C Screening  Never done   INFLUENZA VACCINE  08/21/2021   PAP-Cervical Cytology Screening  08/23/2021 (Originally 11/10/2019)   PAP SMEAR-Modifier  08/23/2021 (Originally 11/10/2019)   TETANUS/TDAP  08/23/2021 (Originally 06/30/2021)   HPV VACCINES  Discontinued   COVID-19 Vaccine  Discontinued    Discussed health benefits of physical activity, and encouraged her to engage in regular exercise appropriate for her age and condition.  Problem List Items Addressed This Visit   None Visit Diagnoses     Annual physical exam    -  Primary   Cervical cancer screening       Need for Tdap vaccination          No follow-ups on file.     Ardith Lewman, NP   

## 2020-09-29 ENCOUNTER — Ambulatory Visit: Payer: Medicaid Other

## 2020-10-02 ENCOUNTER — Ambulatory Visit (INDEPENDENT_AMBULATORY_CARE_PROVIDER_SITE_OTHER): Payer: Self-pay | Admitting: Medical-Surgical

## 2020-10-02 DIAGNOSIS — K219 Gastro-esophageal reflux disease without esophagitis: Secondary | ICD-10-CM | POA: Insufficient documentation

## 2020-10-02 DIAGNOSIS — Z5329 Procedure and treatment not carried out because of patient's decision for other reasons: Secondary | ICD-10-CM

## 2020-10-02 NOTE — Progress Notes (Signed)
No show for appointment. 2 no show appointments in the last 6 months. Letter sent via MyChart.  Thayer Ohm, DNP, APRN, FNP-BC [facility]

## 2020-10-04 ENCOUNTER — Other Ambulatory Visit: Payer: Self-pay

## 2020-10-04 ENCOUNTER — Ambulatory Visit (INDEPENDENT_AMBULATORY_CARE_PROVIDER_SITE_OTHER): Payer: Medicaid Other

## 2020-10-04 ENCOUNTER — Ambulatory Visit (INDEPENDENT_AMBULATORY_CARE_PROVIDER_SITE_OTHER): Payer: Medicaid Other | Admitting: Medical-Surgical

## 2020-10-04 ENCOUNTER — Encounter: Payer: Self-pay | Admitting: Medical-Surgical

## 2020-10-04 VITALS — BP 134/83 | HR 65 | Resp 20 | Ht 67.5 in | Wt 175.0 lb

## 2020-10-04 DIAGNOSIS — Z113 Encounter for screening for infections with a predominantly sexual mode of transmission: Secondary | ICD-10-CM

## 2020-10-04 DIAGNOSIS — L0102 Bockhart's impetigo: Secondary | ICD-10-CM | POA: Diagnosis not present

## 2020-10-04 DIAGNOSIS — M79644 Pain in right finger(s): Secondary | ICD-10-CM | POA: Diagnosis not present

## 2020-10-04 MED ORDER — DOXYCYCLINE HYCLATE 100 MG PO TABS: 100.0000 mg | ORAL_TABLET | Freq: Two times a day (BID) | ORAL | 0 refills | Status: AC

## 2020-10-04 NOTE — Progress Notes (Signed)
  HPI with pertinent ROS:   CC: STD testing, possible spider bite?  HPI: Pleasant 20 year old male presenting today for the following:  STD testing-has a new sexual partner, male and would like to be tested for STDs.  Does not consistently use condoms with all sexual activities.  No current signs or symptoms of STIs.  Possible spider bite-has a genital lesion that he discovered approximately 1 week ago.  Notes that he had left his window to his bedroom open for a couple of days before he remembered it.  Wonders if a spider may have gotten in during that time.  When he did clean his bedroom he found a couple of small brown spiders.  He did not witness a spider in the bed but when he woke up first thing in the morning, he had a bump on the penile shaft.  It was large, swollen, erythematous, and tender.  He has been using an over-the-counter ointment, cannot remember the name.  Notes that this ointment has helped to reduce the swelling, pain, and tenderness.  He still has a bump in the area and is now concerned.  He does note shaving in the general area the night before and having a nick from the razor in the area in question.  Currently, the lesion is not painful although he notes when he tried to manipulate it to see if he can get fluid out, it was very uncomfortable.  Denies fever, chills, and urinary symptoms.  Right hand pain-about 1 month ago, he had a piece of metal strike his right thumb with quite a bit of force.  Since then, he has had pain in the right thumb and when he flexes the thumb completely, he has a notch in the knuckle that was not there previously and is not present on the other hand.  Has not had any evaluation or treatment for this.  I reviewed the past medical history, family history, social history, surgical history, and allergies today and no changes were needed.  Please see the problem list section below in epic for further details.   Physical exam:   General: Well  Developed, well nourished, and in no acute distress.  Neuro: Alert and oriented x3.  HEENT: Normocephalic, atraumatic.  Skin: Warm and dry.  0.5 x 0.75 cm intact pustule to the right penile shaft without erythema, edema, or tenderness.  No drainage noted. Cardiac: Regular rate and rhythm.  Respiratory: Not using accessory muscles, speaking in full sentences.  Impression and Recommendations:    1. Screening for STD (sexually transmitted disease) Checking urine for chlamydia, gonorrhea, and trichomonas.  Blood testing for HIV, hepatitis B, hepatitis C, and syphilis.  Strongly advised to use condoms with all sexual encounters to prevent STIs and pregnancy. - C. trachomatis/N. gonorrhoeae RNA - HIV Antibody (routine testing w rflx) - Hepatitis B surface antigen - Hepatitis C Antibody - RPR - Trichomonas vaginalis RNA, Ql,Males  2. Pain of right thumb Right hand x-rays today.  Tylenol/ibuprofen as needed for discomfort. - DG Hand Complete Right; Future  3.  Pustular folliculitis Doxycycline 100 mg twice daily x7 days.  Okay to use warm compresses and monitor for worsening signs or symptoms.  Return if symptoms worsen or fail to improve. ___________________________________________ Thayer Ohm, DNP, APRN, FNP-BC Primary Care and Sports Medicine [facility] [place]

## 2020-10-05 LAB — C. TRACHOMATIS/N. GONORRHOEAE RNA
C. trachomatis RNA, TMA: NOT DETECTED
N. gonorrhoeae RNA, TMA: NOT DETECTED

## 2020-10-05 LAB — HIV ANTIBODY (ROUTINE TESTING W REFLEX): HIV 1&2 Ab, 4th Generation: NONREACTIVE

## 2020-10-05 LAB — HEPATITIS B SURFACE ANTIGEN: Hepatitis B Surface Ag: NONREACTIVE

## 2020-10-05 LAB — HEPATITIS C ANTIBODY
Hepatitis C Ab: NONREACTIVE
SIGNAL TO CUT-OFF: 0.02 (ref <1.00)

## 2020-10-05 LAB — RPR: RPR Ser Ql: NONREACTIVE

## 2020-10-06 LAB — TRICHOMONAS VAGINALIS RNA, QL,MALES: Trichomonas vaginalis RNA: NOT DETECTED

## 2021-01-26 ENCOUNTER — Ambulatory Visit: Payer: Medicaid Other | Admitting: Family Medicine

## 2021-02-07 ENCOUNTER — Other Ambulatory Visit: Payer: Self-pay

## 2021-02-07 ENCOUNTER — Encounter: Payer: Self-pay | Admitting: Medical-Surgical

## 2021-02-07 ENCOUNTER — Ambulatory Visit (INDEPENDENT_AMBULATORY_CARE_PROVIDER_SITE_OTHER): Payer: Medicaid Other | Admitting: Medical-Surgical

## 2021-02-07 VITALS — BP 125/81 | HR 90 | Resp 20 | Ht 67.0 in | Wt 175.0 lb

## 2021-02-07 DIAGNOSIS — B3742 Candidal balanitis: Secondary | ICD-10-CM | POA: Diagnosis not present

## 2021-02-07 DIAGNOSIS — Z113 Encounter for screening for infections with a predominantly sexual mode of transmission: Secondary | ICD-10-CM | POA: Diagnosis not present

## 2021-02-07 MED ORDER — CLOTRIMAZOLE 1 % EX CREA: 1.0000 "application " | TOPICAL_CREAM | Freq: Two times a day (BID) | CUTANEOUS | 0 refills | Status: AC

## 2021-02-07 NOTE — Progress Notes (Signed)
°  HPI with pertinent ROS:   CC: Penile irritation  HPI: Pleasant 21 year old male presenting today with reports of penile irritation near the head of the penis.  Reports that his significant other is currently pregnant and was recently treated for a UTI with antibiotics.  Unfortunately, the antibiotics caused a yeast infection which then had to be treated.  Notes that he and his significant other were sexually active throughout this time and he noted some irritation and redness to the head of his penis.  This all started about 3 weeks ago for him.  He did use some of the yeast cream that his significant other was provided and noted that the next morning, the redness and irritation was almost completely cleared up.  Unfortunately, it has since returned.  He is uncircumcised and while he does practice good foreskin hygiene, he has noted a bit of thick white discharge that builds up at the end of the day.  Has not tried any other home remedies or over-the-counter treatments.  Requesting to update his screening for STDs.  I reviewed the past medical history, family history, social history, surgical history, and allergies today and no changes were needed.  Please see the problem list section below in epic for further details.   Physical exam:   Physical Exam Vitals and nursing note reviewed.  Constitutional:      General: He is not in acute distress.    Appearance: Normal appearance.  HENT:     Head: Normocephalic and atraumatic.  Cardiovascular:     Rate and Rhythm: Normal rate and regular rhythm.     Pulses: Normal pulses.     Heart sounds: Normal heart sounds. No murmur heard.   No friction rub. No gallop.  Pulmonary:     Effort: Pulmonary effort is normal. No respiratory distress.     Breath sounds: Normal breath sounds.  Genitourinary:    Comments: Deferred.  Treating empirically on description of symptoms. Skin:    General: Skin is warm and dry.  Neurological:     Mental Status: He  is alert and oriented to person, place, and time.  Psychiatric:        Mood and Affect: Mood normal.        Behavior: Behavior normal.        Thought Content: Thought content normal.        Judgment: Judgment normal.     Impression and Recommendations:    1. Candidal balanitis Sending in clotrimazole 1% external cream to use twice daily to the affected area.  Recommend using this for at least 7 days or until it has been at least 48 hours after full resolution of the rash.  Printed patient information on balanitis as well as recommendations for foreskin hygiene included with AVS.  2. Screening for STD (sexually transmitted disease) Checking STIs as below. - HIV Antibody (routine testing w rflx) - Hepatitis C Antibody - Hepatitis B surface antigen - RPR - C. trachomatis/N. gonorrhoeae RNA  Return if symptoms worsen or fail to improve. ___________________________________________ Thayer Ohm, DNP, APRN, FNP-BC Primary Care and Sports Medicine [facility] [place]

## 2021-02-07 NOTE — Patient Instructions (Addendum)
Balanitis Balanitis is swelling and irritation of the head of the penis (glans penis). Balanitis occurs most often among males who have not had their foreskin removed (uncircumcised). In uncircumcised males, the condition may also cause inflammation of the skin around the foreskin. Balanitis sometimes causes scarring of the penis or foreskin, which can require surgery. This condition may develop because of an infection or another medical condition. Untreated balanitis can increase the risk of penile cancer. What are the causes? Common causes of this condition include: Irritation and lack of airflow due to fluid (smegma) that can build up on the glans penis. Poor personal hygiene, especially in uncircumcised males. Not cleaning the glans penis and foreskin well can result in a buildup of bacteria, viruses, and yeast, which can lead to infection and inflammation. Other causes include: Chemical irritation from products such as soaps or shower gels, especially those that have fragrance. Chemical irritation can also be caused by condoms, personal lubricants, petroleum jelly, spermicides, fabric softeners, or laundry detergents. Skin conditions, such as eczema, dermatitis, and psoriasis. Allergies to medicines, such as tetracycline and sulfa drugs. What increases the risk? The following factors may make you more likely to develop this condition: Being an uncircumcised male. Having diabetes. Having other medical conditions, including liver cirrhosis, congestive heart failure, or kidney disease. Having infections, such as candidiasis, HPV (human papillomavirus), herpes simplex, gonorrhea, or syphilis. Having a tight foreskin that is difficult to pull back (retract) past the glans penis. Being severely obese. History of reactive arthritis. What are the signs or symptoms? Symptoms of this condition include: Discharge from under the foreskin, and pain or difficulty retracting the foreskin. A bad smell or  itchiness on the penis. Tenderness, redness, and swelling of the glans penis. A rash or sores on the glans penis or foreskin. Inability to get an erection due to pain. Trouble urinating. Scarring of the penis or foreskin, in some cases. How is this diagnosed? This condition may be diagnosed based on a physical exam and tests of a swab of discharge to check for bacterial or fungal infection. You may also have blood tests to check for: Viruses that can cause balanitis. A high blood sugar (glucose) level. This could be a sign of diabetes, which can increase the risk of balanitis. How is this treated? Treatment for this condition depends on the cause. Treatment may include: Improving personal hygiene. Your health care provider may recommend sitting in a bath of warm water that is deep enough to cover your hips and buttocks (sitz bath). Medicines such as: Creams or ointments to reduce swelling (steroids) or to treat an infection. Antibiotic medicine. Antifungal medicine. Having surgery to remove or cut the foreskin (circumcision). This may be done if you have scarring on the foreskin that makes it difficult to retract. Controlling other medical problems that may be causing your condition or making it worse. Follow these instructions at home: Medicines Take over-the-counter and prescription medicines only as told by your health care provider. If you were prescribed an antibiotic medicine, use it as told by your health care provider. Do not stop using the antibiotic even if you start to feel better. General instructions Do not have sex until the condition clears up, or until your health care provider approves. Keep your penis clean and dry. Take sitz baths as recommended by your health care provider. Avoid products that irritate your skin or make symptoms worse, such as soaps and shower gels that have fragrance. Keep all follow-up visits. This is important.  Contact a health care provider  if: Your symptoms get worse or do not improve with home care. You develop chills or a fever. You have trouble urinating. You cannot retract your foreskin. Get help right away if: You develop severe pain. You are unable to urinate. Summary Balanitis is swelling and irritation of the head of the penis (glans penis). This condition is most common among uncircumcised males. Balanitis causes pain, redness, and swelling of the glans penis. Good personal hygiene is important. Treatment may include improving personal hygiene and applying creams or ointments. Contact a health care provider if your symptoms get worse or do not improve with home care. This information is not intended to replace advice given to you by your health care provider. Make sure you discuss any questions you have with your health care provider. Document Revised: 06/21/2020 Document Reviewed: 06/21/2020 Elsevier Patient Education  2022 [place], Adult The foreskin is the loose skin that covers the head of the penis (glans). Keeping the foreskin area clean can help prevent infection and other conditions. If this area is not cleaned, a creamy substance called smegma can collect under the foreskin and cause odor and irritation. Supplies needed: Mild soap. Water. How to keep the foreskin area clean Wash the area under the foreskin once a day in the shower or bathtub. To do this: Gently pull back (retract) the foreskin to uncover the glans. Do not retract the foreskin farther back than is comfortable. The distance that the foreskin can retract varies from person to person. Wash the glans with mild soap and water. Rinse the area thoroughly. Dry the glans when you get out of the shower or bathtub. Slide the foreskin back to its regular position. During urination, always retract a bit of foreskin to keep the glans clean. Contact a health care provider if: You have trouble performing any of the steps for  cleaning. You have pain when you urinate or you cannot urinate. You have pain in your penis. Your penis becomes irritated. Your foreskin becomes red, swollen, or painful. Your penis develops an odor that does not go away with regular cleaning. Get help right away if: You cannot slide your foreskin back over the glans after you retract it. You have swelling of the penis. Summary Keep the foreskin clean to prevent infections and other conditions. Wash the area once a day with water and mild soap. Dry your foreskin thoroughly. Contact a health care provider if you have pain while urinating or you cannot urinate. Get help right away if you cannot slide your foreskin back over the glans or you have swelling in your penis. This information is not intended to replace advice given to you by your health care provider. Make sure you discuss any questions you have with your health care provider. Document Revised: 10/02/2018 Document Reviewed: 10/02/2018 Elsevier Patient Education  [place].

## 2021-02-08 LAB — HIV ANTIBODY (ROUTINE TESTING W REFLEX): HIV 1&2 Ab, 4th Generation: NONREACTIVE

## 2021-02-08 LAB — HEPATITIS B SURFACE ANTIGEN: Hepatitis B Surface Ag: NONREACTIVE

## 2021-02-08 LAB — HEPATITIS C ANTIBODY
Hepatitis C Ab: NONREACTIVE
SIGNAL TO CUT-OFF: 0.08 (ref <1.00)

## 2021-02-08 LAB — RPR: RPR Ser Ql: NONREACTIVE

## 2021-02-08 LAB — C. TRACHOMATIS/N. GONORRHOEAE RNA
C. trachomatis RNA, TMA: NOT DETECTED
N. gonorrhoeae RNA, TMA: NOT DETECTED

## 2022-01-23 IMAGING — DX DG HAND COMPLETE 3+V*R*
3 series · 3 of 3 positions shown · non-contrast
Comparison: None.

CLINICAL DATA: Right thumb injury

EXAM:
RIGHT HAND - COMPLETE 3+ VIEW

[hand pa]
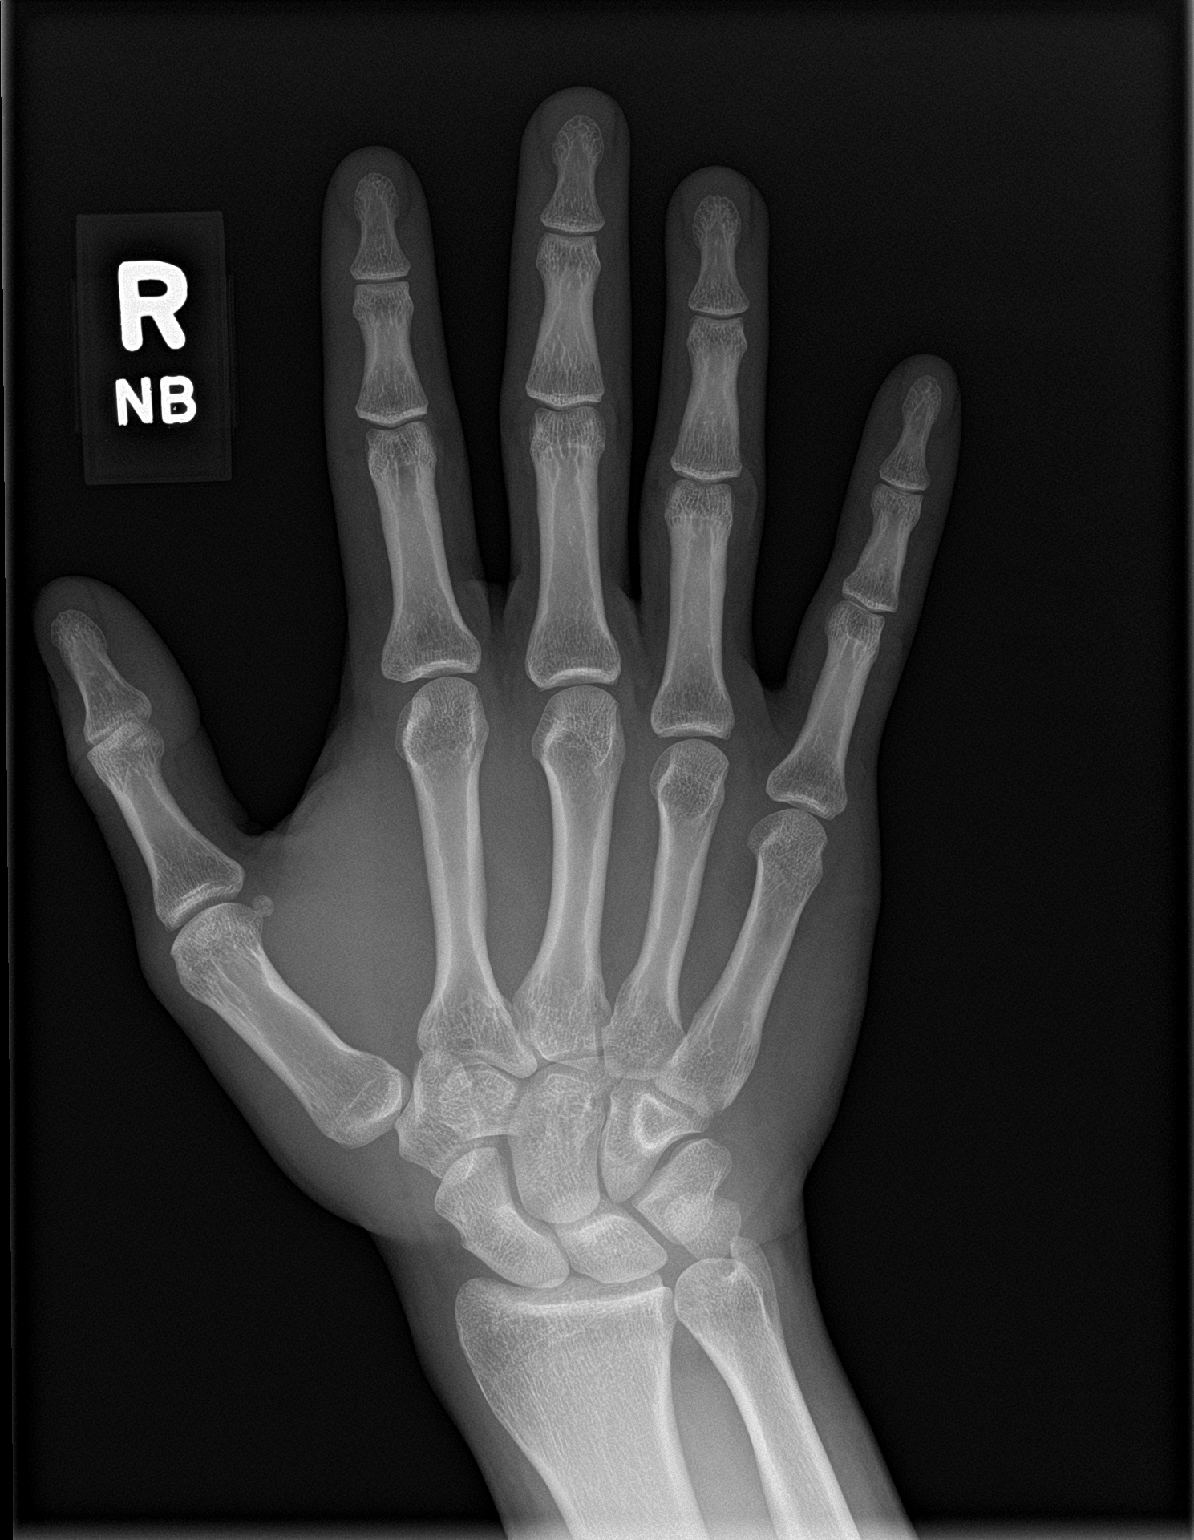

[hand obl]
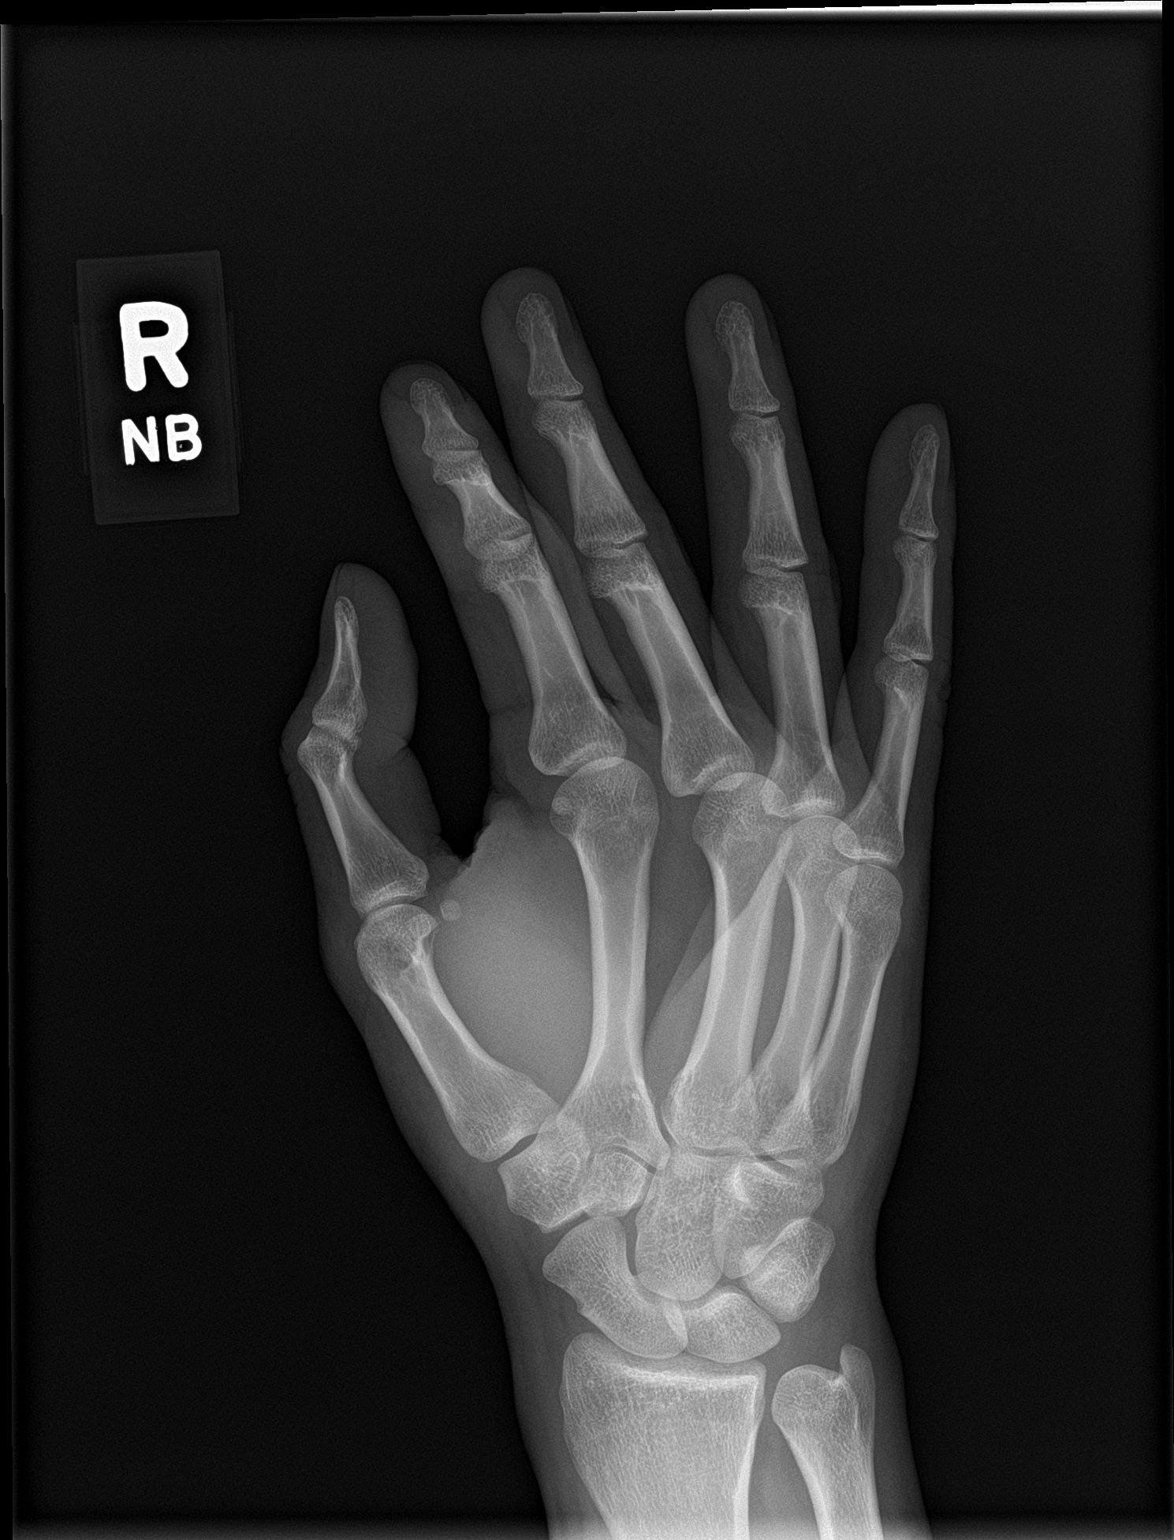

[hand lat]
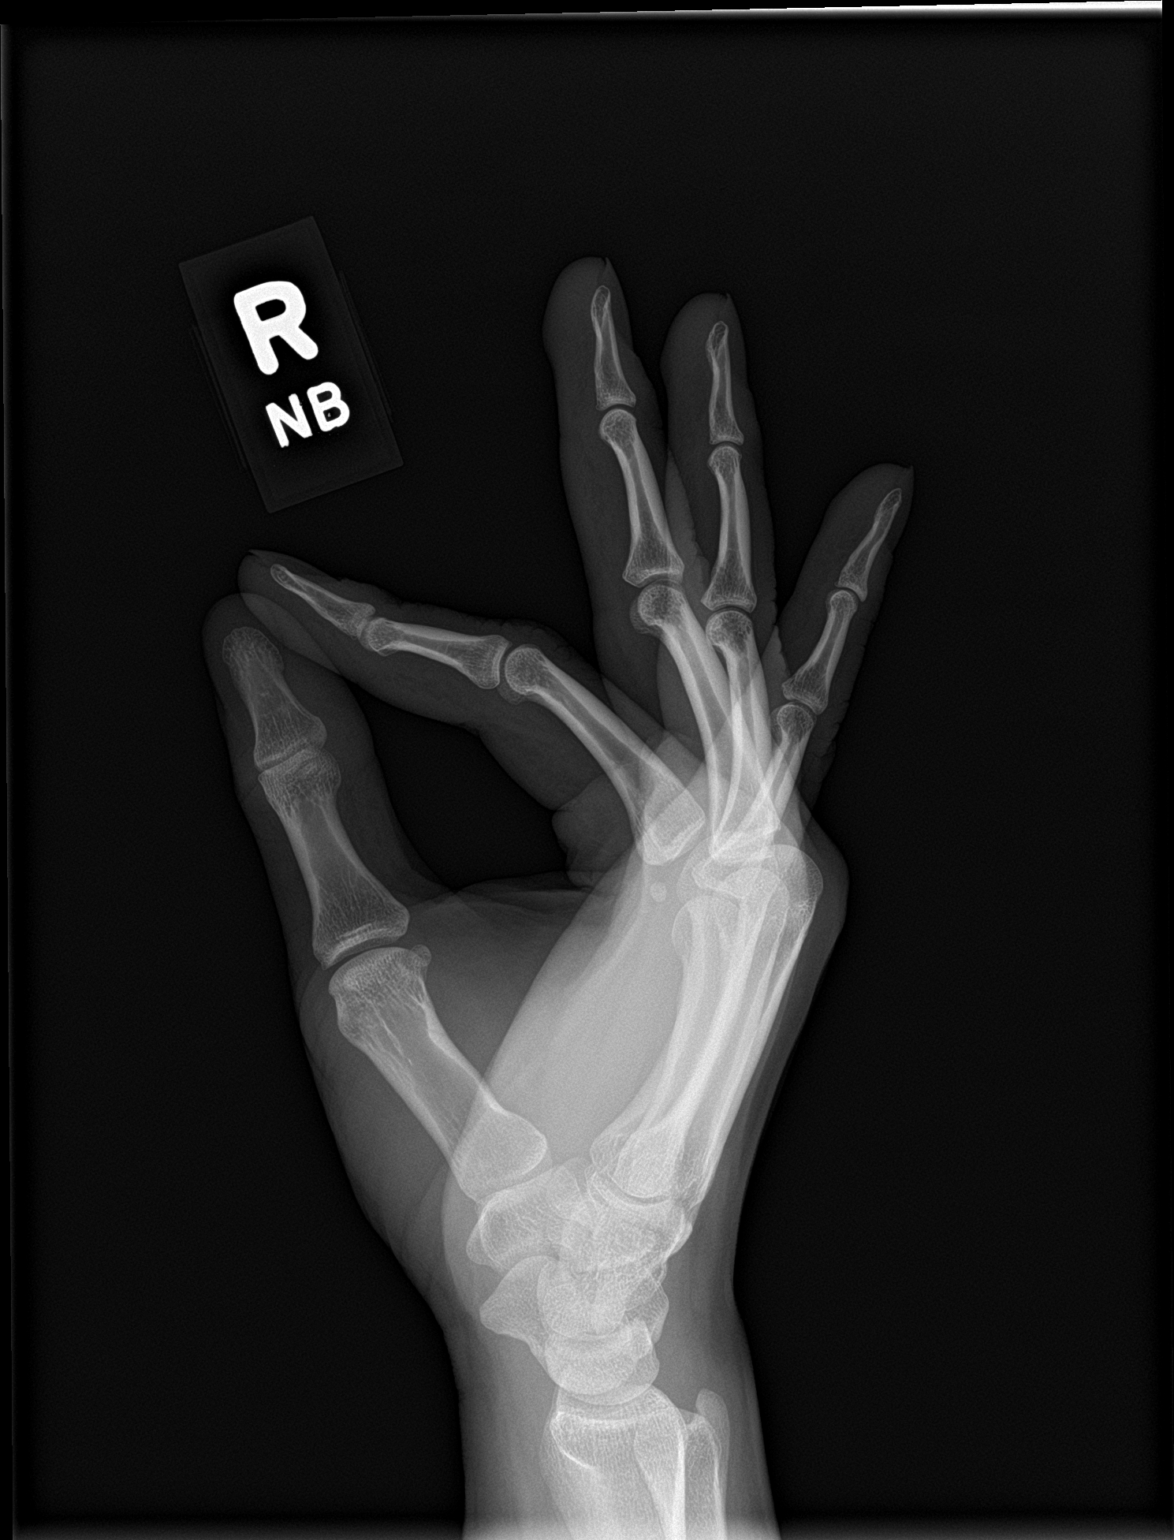

[3 of 3 positions shown; findings below may reference images not displayed]

FINDINGS: There is no evidence of fracture or dislocation. There is no
evidence of arthropathy or other focal bone abnormality. Soft
tissues are unremarkable.
IMPRESSION: Negative.

## 2023-04-07 ENCOUNTER — Emergency Department (HOSPITAL_COMMUNITY)

## 2023-04-07 ENCOUNTER — Emergency Department (HOSPITAL_COMMUNITY)
Admission: EM | Admit: 2023-04-07 | Discharge: 2023-04-07 | Disposition: A | Discharge status: Home / Self Care | Attending: Emergency Medicine | Admitting: Emergency Medicine

## 2023-04-07 ENCOUNTER — Encounter (HOSPITAL_COMMUNITY): Payer: Self-pay | Admitting: Emergency Medicine

## 2023-04-07 ENCOUNTER — Other Ambulatory Visit: Payer: Self-pay

## 2023-04-07 DIAGNOSIS — R0789 Other chest pain: Secondary | ICD-10-CM | POA: Insufficient documentation

## 2023-04-07 DIAGNOSIS — M546 Pain in thoracic spine: Secondary | ICD-10-CM | POA: Diagnosis not present

## 2023-04-07 DIAGNOSIS — M545 Low back pain, unspecified: Secondary | ICD-10-CM | POA: Insufficient documentation

## 2023-04-07 DIAGNOSIS — Y92410 Unspecified street and highway as the place of occurrence of the external cause: Secondary | ICD-10-CM | POA: Insufficient documentation

## 2023-04-07 DIAGNOSIS — S060X0A Concussion without loss of consciousness, initial encounter: Secondary | ICD-10-CM | POA: Insufficient documentation

## 2023-04-07 DIAGNOSIS — R519 Headache, unspecified: Secondary | ICD-10-CM | POA: Diagnosis present

## 2023-04-07 MED ORDER — NAPROXEN 375 MG PO TABS: 375.0000 mg | ORAL_TABLET | Freq: Two times a day (BID) | ORAL | 0 refills | Status: AC

## 2023-04-07 MED ORDER — PROCHLORPERAZINE MALEATE 10 MG PO TABS: 10.0000 mg | ORAL_TABLET | Freq: Two times a day (BID) | ORAL | 0 refills | Status: AC | PRN
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
## 2023-04-07 NOTE — Discharge Instructions (Signed)
 Return to the emergency department immediately if you develop any of the following symptoms: You have numbness, tingling, or weakness in the arms or legs. You develop severe headaches not relieved with medicine. You have severe neck pain, especially tenderness in the middle of the back of your neck. You have changes in bowel or bladder control. There is increasing pain in any area of the body. You have shortness of breath, light-headedness, dizziness, or fainting. You have chest pain. You feel sick to your stomach (nauseous), throw up (vomit), or sweat. You have increasing abdominal discomfort. There is blood in your urine, stool, or vomit. You have pain in your shoulder (shoulder strap areas). You feel your symptoms are getting worse.
